# Patient Record
Sex: Male | Born: 1995
Health system: Southern US, Community
[De-identification: ages and names within clinical notes are randomized; demographics above are authoritative.]

## PROBLEM LIST (undated history)

## (undated) DIAGNOSIS — T7840XA Allergy, unspecified, initial encounter: Secondary | ICD-10-CM

## (undated) DIAGNOSIS — Z9109 Other allergy status, other than to drugs and biological substances: Secondary | ICD-10-CM

## (undated) DIAGNOSIS — R41840 Attention and concentration deficit: Secondary | ICD-10-CM

## (undated) DIAGNOSIS — F4010 Social phobia, unspecified: Secondary | ICD-10-CM

## (undated) DIAGNOSIS — J45909 Unspecified asthma, uncomplicated: Secondary | ICD-10-CM

## (undated) DIAGNOSIS — J189 Pneumonia, unspecified organism: Secondary | ICD-10-CM

## (undated) HISTORY — DX: Social phobia, unspecified: F40.10

## (undated) HISTORY — DX: Allergy, unspecified, initial encounter: T78.40XA

## (undated) HISTORY — DX: Unspecified asthma, uncomplicated: J45.909

## (undated) HISTORY — DX: Pneumonia, unspecified organism: J18.9

## (undated) HISTORY — DX: Attention and concentration deficit: R41.840

---
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
## 2016-01-19 DIAGNOSIS — W208XXA Other cause of strike by thrown, projected or falling object, initial encounter: Secondary | ICD-10-CM | POA: Diagnosis not present

## 2016-01-19 DIAGNOSIS — S8992XA Unspecified injury of left lower leg, initial encounter: Secondary | ICD-10-CM | POA: Diagnosis not present

## 2016-01-19 DIAGNOSIS — W19XXXA Unspecified fall, initial encounter: Secondary | ICD-10-CM | POA: Diagnosis not present

## 2016-01-19 DIAGNOSIS — Z23 Encounter for immunization: Secondary | ICD-10-CM | POA: Diagnosis not present

## 2016-01-19 DIAGNOSIS — S6991XA Unspecified injury of right wrist, hand and finger(s), initial encounter: Secondary | ICD-10-CM | POA: Diagnosis not present

## 2016-01-22 DIAGNOSIS — N200 Calculus of kidney: Secondary | ICD-10-CM | POA: Diagnosis not present

## 2016-02-18 DIAGNOSIS — K080 Exfoliation of teeth due to systemic causes: Secondary | ICD-10-CM | POA: Diagnosis not present

## 2016-03-03 DIAGNOSIS — H52203 Unspecified astigmatism, bilateral: Secondary | ICD-10-CM | POA: Diagnosis not present

## 2016-05-18 DIAGNOSIS — B9689 Other specified bacterial agents as the cause of diseases classified elsewhere: Secondary | ICD-10-CM | POA: Diagnosis not present

## 2016-05-18 DIAGNOSIS — R0989 Other specified symptoms and signs involving the circulatory and respiratory systems: Secondary | ICD-10-CM | POA: Diagnosis not present

## 2016-05-18 DIAGNOSIS — J329 Chronic sinusitis, unspecified: Secondary | ICD-10-CM | POA: Diagnosis not present

## 2016-07-28 DIAGNOSIS — J0140 Acute pansinusitis, unspecified: Secondary | ICD-10-CM | POA: Diagnosis not present

## 2016-08-20 ENCOUNTER — Emergency Department (INDEPENDENT_AMBULATORY_CARE_PROVIDER_SITE_OTHER)
Admission: EM | Admit: 2016-08-20 | Discharge: 2016-08-20 | Disposition: A | Discharge status: Home / Self Care | Payer: BLUE CROSS/BLUE SHIELD | Source: Home / Self Care | Attending: Family Medicine | Admitting: Family Medicine

## 2016-08-20 ENCOUNTER — Emergency Department (INDEPENDENT_AMBULATORY_CARE_PROVIDER_SITE_OTHER): Payer: BLUE CROSS/BLUE SHIELD

## 2016-08-20 ENCOUNTER — Encounter: Payer: Self-pay | Admitting: Emergency Medicine

## 2016-08-20 DIAGNOSIS — J189 Pneumonia, unspecified organism: Secondary | ICD-10-CM

## 2016-08-20 DIAGNOSIS — J181 Lobar pneumonia, unspecified organism: Secondary | ICD-10-CM | POA: Diagnosis not present

## 2016-08-20 DIAGNOSIS — J4521 Mild intermittent asthma with (acute) exacerbation: Secondary | ICD-10-CM

## 2016-08-20 DIAGNOSIS — R05 Cough: Secondary | ICD-10-CM | POA: Diagnosis not present

## 2016-08-20 MED ORDER — PREDNISONE 20 MG PO TABS: ORAL_TABLET | ORAL | 0 refills | Status: DC

## 2016-08-20 MED ORDER — DEXAMETHASONE SODIUM PHOSPHATE 10 MG/ML IJ SOLN
10.0000 mg | Freq: Once | INTRAMUSCULAR | Status: AC
  Administered 2016-08-20: 10 mg via INTRAMUSCULAR

## 2016-08-20 MED ORDER — IPRATROPIUM-ALBUTEROL 0.5-2.5 (3) MG/3ML IN SOLN
3.0000 mL | Freq: Once | RESPIRATORY_TRACT | Status: AC
  Administered 2016-08-20: 3 mL via RESPIRATORY_TRACT

## 2016-08-20 MED ORDER — ALBUTEROL SULFATE HFA 108 (90 BASE) MCG/ACT IN AERS: 1.0000 | INHALATION_SPRAY | Freq: Four times a day (QID) | RESPIRATORY_TRACT | 0 refills | Status: DC | PRN

## 2016-08-20 MED ORDER — AZITHROMYCIN 250 MG PO TABS: 250.0000 mg | ORAL_TABLET | Freq: Every day | ORAL | 0 refills | Status: DC

## 2016-08-20 NOTE — ED Provider Notes (Signed)
CSN: [id number]     Arrival date & time 08/20/16  1606 History   First MD Initiated Contact with Patient 08/20/16 1629     Chief Complaint  Patient presents with  . Cough   (Consider location/radiation/quality/duration/timing/severity/associated sxs/prior Treatment) HPI  Tanner Vincent is a 21 y.o. male presenting to UC with c/o 2 days of productive cough with brown and green mucous. Cough was so bad yesterday he almost vomited.  Hx of asthma and environmental allergies but he does not currently have an inhaler. Denies fever, chills, nausea, vomiting or diarrhea. Pt notes his girlfriend has been sick for longer but pt notes she is not as sick as him.     Past Medical History:  Diagnosis Date  . Environmental allergies    History reviewed. No pertinent surgical history. No family history on file. Social History  Substance Use Topics  . Smoking status: Light Tobacco Smoker  . Smokeless tobacco: Never Used  . Alcohol use Yes    Review of Systems  Constitutional: Negative for chills and fever.  HENT: Positive for congestion, postnasal drip and rhinorrhea. Negative for ear pain, sore throat, trouble swallowing and voice change.   Respiratory: Positive for cough and chest tightness. Negative for shortness of breath.   Cardiovascular: Negative for chest pain and palpitations.  Gastrointestinal: Negative for abdominal pain, diarrhea, nausea and vomiting.  Musculoskeletal: Negative for arthralgias, back pain and myalgias.  Skin: Negative for rash.    Allergies  Other  Home Medications   Prior to Admission medications   Medication Sig Start Date End Date Taking? Authorizing Provider  albuterol (PROVENTIL HFA;VENTOLIN HFA) 108 (90 Base) MCG/ACT inhaler Inhale 1-2 puffs into the lungs every 6 (six) hours as needed for wheezing or shortness of breath. 08/20/16   Junius Finner, PA-C  azithromycin (ZITHROMAX) 250 MG tablet Take 1 tablet (250 mg total) by mouth daily. Take first 2 tablets  together, then 1 every day until finished. 08/20/16   Junius Finner, PA-C  predniSONE (DELTASONE) 20 MG tablet 3 tabs po day one, then 2 po daily x 4 days 08/20/16   Junius Finner, PA-C   Meds Ordered and Administered this Visit   Medications  dexamethasone (DECADRON) injection 10 mg (10 mg Intramuscular Given 08/20/16 1639)  ipratropium-albuterol (DUONEB) 0.5-2.5 (3) MG/3ML nebulizer solution 3 mL (3 mLs Nebulization Given 08/20/16 1640)    BP 100/65 (BP Location: Left Arm)   Pulse 69   Temp 98.5 F (36.9 C) (Oral)   Ht 6\' 3"  (1.905 m)   Wt 202 lb (91.6 kg)   SpO2 96%   BMI 25.25 kg/m  No data found.   Physical Exam  Constitutional: He is oriented to person, place, and time. He appears well-developed and well-nourished. No distress.  HENT:  Head: Normocephalic and atraumatic.  Right Ear: Tympanic membrane normal.  Left Ear: Tympanic membrane normal.  Nose: Nose normal.  Mouth/Throat: Uvula is midline, oropharynx is clear and moist and mucous membranes are normal.  Eyes: EOM are normal.  Neck: Normal range of motion. Neck supple.  Cardiovascular: Normal rate and regular rhythm.   Pulmonary/Chest: Effort normal. No stridor. No respiratory distress. He has wheezes. He has rales.  Diffuse wheeze and rales w/o respiratory distress. No accessory muscle use.   Musculoskeletal: Normal range of motion.  Lymphadenopathy:    He has no cervical adenopathy.  Neurological: He is alert and oriented to person, place, and time.  Skin: Skin is warm and dry. He is not diaphoretic.  Psychiatric: He has a normal mood and affect. His behavior is normal.  Nursing note and vitals reviewed.   Urgent Care Course     Procedures (including critical care time)  Labs Review Labs Reviewed - No data to display  Imaging Review Dg Chest 2 View  Result Date: 08/20/2016 CLINICAL DATA:  Productive cough with green sputum for 2 days, brown mucus, wheezing EXAM: CHEST  2 VIEW COMPARISON:  None FINDINGS:  Normal heart size, mediastinal contours, and pulmonary vascularity. Minimal central peribronchial thickening. Atelectasis versus infiltrate in retrocardiac LEFT lower lobe. Lungs otherwise clear. No pleural effusion or pneumothorax. Bones unremarkable. IMPRESSION: Atelectasis versus infiltrate in retrocardiac LEFT lower lobe. Electronically Signed   By: Ulyses SouthwardMark  Boles M.D.   On: 08/20/2016 17:00     MDM   1. Pneumonia of left lower lobe due to infectious organism (HCC)   2. Mild intermittent asthma with exacerbation    Hx and exam concerning for Left lower lobe pneumonia.  Tx in UC: Decadron 10mg  IM and duoneb  Lungs: still diffuse wheeze but pt states he does feel somewhat improved.  Rx: Prednisone, azithromycin, albuterol F/u with PCP in 1 week if not improving, sooner if worsening. Resource guide provided.     Junius Finnerrin O'Malley, PA-C 08/20/16 1717

## 2016-08-20 NOTE — ED Triage Notes (Signed)
Productive cough with green, brown mucus, congestion x 2 days

## 2016-08-20 NOTE — Discharge Instructions (Signed)
°  You may take 400-600mg  Ibuprofen (Motrin) every 6-8 hours for fever and pain  Alternate with Tylenol  You may take 500mg  Tylenol every 4-6 hours as needed for fever and pain  Follow-up with your primary care provider next week for recheck of symptoms if not improving.  Be sure to drink plenty of fluids and rest, at least 8hrs of sleep a night, preferably more while you are sick. Return urgent care or go to closest ER if you cannot keep down fluids/signs of dehydration, fever not reducing with Tylenol, difficulty breathing/wheezing, stiff neck, worsening condition, or other concerns (see below)  Please take antibiotics as prescribed and be sure to complete entire course even if you start to feel better to ensure infection does not come back.  You have been prescribed 5 days of prednisone, an oral steroid to help with inflammation in your chest and coughing.  You may start this medication tomorrow with breakfast as you were given the first dose today in urgent care.

## 2016-08-26 ENCOUNTER — Other Ambulatory Visit: Payer: Self-pay

## 2016-08-26 ENCOUNTER — Encounter: Payer: Self-pay | Admitting: Physician Assistant

## 2016-08-26 ENCOUNTER — Ambulatory Visit (INDEPENDENT_AMBULATORY_CARE_PROVIDER_SITE_OTHER): Payer: BLUE CROSS/BLUE SHIELD | Admitting: Physician Assistant

## 2016-08-26 VITALS — BP 118/79 | HR 82 | Ht 74.0 in | Wt 201.0 lb

## 2016-08-26 DIAGNOSIS — J189 Pneumonia, unspecified organism: Secondary | ICD-10-CM | POA: Insufficient documentation

## 2016-08-26 DIAGNOSIS — R41840 Attention and concentration deficit: Secondary | ICD-10-CM | POA: Diagnosis not present

## 2016-08-26 DIAGNOSIS — J181 Lobar pneumonia, unspecified organism: Secondary | ICD-10-CM | POA: Diagnosis not present

## 2016-08-26 DIAGNOSIS — F4010 Social phobia, unspecified: Secondary | ICD-10-CM | POA: Diagnosis not present

## 2016-08-26 DIAGNOSIS — R062 Wheezing: Secondary | ICD-10-CM | POA: Diagnosis not present

## 2016-08-26 DIAGNOSIS — J3089 Other allergic rhinitis: Secondary | ICD-10-CM | POA: Diagnosis not present

## 2016-08-26 HISTORY — DX: Social phobia, unspecified: F40.10

## 2016-08-26 HISTORY — DX: Attention and concentration deficit: R41.840

## 2016-08-26 MED ORDER — BUDESONIDE-FORMOTEROL FUMARATE 80-4.5 MCG/ACT IN AERO: 2.0000 | INHALATION_SPRAY | Freq: Two times a day (BID) | RESPIRATORY_TRACT | 3 refills | Status: DC

## 2016-08-26 MED ORDER — IPRATROPIUM-ALBUTEROL 0.5-2.5 (3) MG/3ML IN SOLN: 3.0000 mL | Freq: Once | RESPIRATORY_TRACT | Status: DC

## 2016-08-26 MED ORDER — MONTELUKAST SODIUM 10 MG PO TABS: 10.0000 mg | ORAL_TABLET | Freq: Every day | ORAL | 3 refills | Status: DC

## 2016-08-26 MED ORDER — ATOMOXETINE HCL 10 MG PO CAPS: ORAL_CAPSULE | ORAL | 0 refills | Status: DC

## 2016-08-26 MED ORDER — NONFORMULARY OR COMPOUNDED ITEM: 0 refills | Status: DC

## 2016-08-26 NOTE — Progress Notes (Signed)
HPI:                                                                Tanner Vincent is a 21 y.o. male who [facility] today for pneumonia follow-up  Patient with history of allergic rhinitis and allergies was diagnosed with CAP of LLL in urgent care on 08/20/16. Patient reports he feels 80% improved. Still has daily wheezing and occasional cough productive of green sputum. Endorses dyspnea with daily activities. He has used Albuterol inhaler 5 times over the past week. Denies hemoptysis. ]Denies fever, chills, myalgias, malaise. He does smoke marijuana 4-5 days per week.  Patient also reports anxiety and social phobia. Patient states symptoms have been present for years. He states that he took a friends Vyvanse and this helped. He endorses irritability, difficulty relaxing, and difficulty concentrating most days. He denies depressed mood or suicidal thinking. He has never taken medication for anxiety or depression.   Past Medical History:  Diagnosis Date  . Allergy   . Asthma   . Environmental allergies    No past surgical history on file. Social History  Substance Use Topics  . Smoking status: Never Smoker  . Smokeless tobacco: Never Used  . Alcohol use Yes   family history includes COPD in his paternal aunt and paternal uncle.  ROS: negative except as noted in the HPI  Medications: Current Outpatient Prescriptions  Medication Sig Dispense Refill  . atomoxetine (STRATTERA) 10 MG capsule Take 1 capsule by mouth twice a day for 1 week, then increase to two capsules twice a day 90 capsule 0  . budesonide-formoterol (SYMBICORT) 80-4.5 MCG/ACT inhaler Inhale 2 puffs into the lungs 2 (two) times daily. 1 Inhaler 3  . montelukast (SINGULAIR) 10 MG tablet Take 1 tablet (10 mg total) by mouth daily. 30 tablet 3  . NONFORMULARY OR COMPOUNDED ITEM Dispense 1 spacer for MDI inhaler 1 each 0   Current Facility-Administered Medications   Medication Dose Route Frequency Provider Last Rate Last Dose  . ipratropium-albuterol (DUONEB) 0.5-2.5 (3) MG/3ML nebulizer solution 3 mL  3 mL Nebulization Once Carlis Stableharley Elizabeth Cummings, PA-C       No Known Allergies     Objective:  BP 118/79   Pulse 82   Ht 6\' 2"  (1.88 m)   Wt 201 lb (91.2 kg)   BMI 25.81 kg/m  Gen: well-groomed, cooperative, not ill-appearing, no distress HEENT: normal conjunctiva, TM's clear, oropharynx clear, moist mucus membranes, no frontal or maxillary sinus tenderness Pulm: Normal work of breathing, normal phonation, coarse breath sounds with expiratory wheezes bilaterally CV: Normal rate, regular rhythm, s1 and s2 distinct, no murmurs, clicks or rubs  Neuro: alert and oriented x 3, EOM's intact Lymph: no cervical or tonsillar adenopathy Skin: warm and dry, no rashes or lesions on exposed skin, no cyanosis Psych: good eye contact, slightly anxious affect, euthymic mood, normal speech and thought content  CXR 08/20/16 EXAM: CHEST  2 VIEW  COMPARISON:  None  FINDINGS: Normal heart size, mediastinal contours, and pulmonary vascularity.  Minimal central peribronchial thickening.  Atelectasis versus infiltrate in retrocardiac LEFT lower lobe.  Lungs otherwise clear.  No pleural effusion or pneumothorax.  Bones unremarkable.  IMPRESSION: Atelectasis versus infiltrate in retrocardiac  LEFT lower lobe.   Electronically Signed   By: Ulyses Southward M.D.   On: 08/20/2016 17:00  Depression screen PHQ 2/9 08/26/2016  Decreased Interest 3  Down, Depressed, Hopeless 0  PHQ - 2 Score 3  Altered sleeping 2  Tired, decreased energy 2  Change in appetite 1  Feeling bad or failure about yourself  2  Trouble concentrating 2  Moving slowly or fidgety/restless 1  Suicidal thoughts 0  PHQ-9 Score 13   GAD 7 : Generalized Anxiety Score 08/26/2016  Nervous, Anxious, on Edge 2  Control/stop worrying 1  Worry too much - different things 1  Trouble  relaxing 2  Restless 1  Easily annoyed or irritable 3  Afraid - awful might happen 0  Total GAD 7 Score 10     Assessment and Plan: 21 y.o. male with   Wheezing, CAP of LLL - pneumonia is improved. Suspect there is underlying reactive airway disease. Duoneb given in clinic today and patient felt improvement of symptoms - starting symbicort 2 puffs bid. Educated to use spacer and rinse mouth thoroughly to prevent thrush - instructed to avoid smoking marijuana. Encouraged to take deep breaths regularly to prevent atelectasis  - follow-up spirometry in 6 weeks - repeat CXR in 6 weeks since patient does smoke and continues to have wheezing and dyspnea. Guidelines recommend follow-up CXR for resolution of pneumonia in 7-12 weeks since radiographic findings lag behind clinical resolution - budesonide-formoterol (SYMBICORT) 80-4.5 MCG/ACT inhaler; Inhale 2 puffs into the lungs 2 (two) times daily.  Dispense: 1 Inhaler; Refill: 3 - DG Chest 2 View; Future  Chronic non-seasonal allergic rhinitis, unspecified trigger - montelukast (SINGULAIR) 10 MG tablet; Take 1 tablet (10 mg total) by mouth daily.  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 SNRI should benefit both anxiety/depressive symptoms and any ADHD that may be underlying - recommended 4 week follow-up, but patient prefers to follow-up in 6 weeks - will refer for formal ADHD testing if patient does not respond to Strattera - atomoxetine (STRATTERA) 10 MG capsule; Take 1 capsule by mouth twice a day for 1 week, then increase to two capsules twice a day  Dispense: 90 capsule; Refill: 0   Patient education and anticipatory guidance given Patient agrees with treatment plan Follow-up in 6 weeks or sooner as needed   Levonne Hubert PA-C

## 2016-08-26 NOTE — Patient Instructions (Addendum)
Start Symbicort inhaler - 2 puffs twice a day Use spacer  Rinse mouth well after each use to prevent oral thrush  Start Singulair - 1 tab daily for allergies  Avoiding smoking  Make sure you are taking deep breathes regularly to prevent collapse of your small airways  Return if you develop fever or worsening shortness of breath  Repeat Chest Xray in 6 weeks Spirometry (pulmonary function testing) in 6 weeks

## 2016-08-31 ENCOUNTER — Telehealth: Payer: Self-pay | Admitting: *Deleted

## 2016-08-31 NOTE — Telephone Encounter (Signed)
Pre Authorization sent to cover my meds.[id number]

## 2016-09-03 NOTE — Telephone Encounter (Signed)
This medication has already been approved. (see scanned letter)

## 2016-09-07 ENCOUNTER — Encounter: Payer: Self-pay | Admitting: Emergency Medicine

## 2016-09-07 ENCOUNTER — Emergency Department
Admission: EM | Admit: 2016-09-07 | Discharge: 2016-09-07 | Disposition: A | Discharge status: Home / Self Care | Payer: BLUE CROSS/BLUE SHIELD | Source: Home / Self Care | Attending: Family Medicine | Admitting: Family Medicine

## 2016-09-07 ENCOUNTER — Emergency Department (INDEPENDENT_AMBULATORY_CARE_PROVIDER_SITE_OTHER): Payer: BLUE CROSS/BLUE SHIELD

## 2016-09-07 DIAGNOSIS — R05 Cough: Secondary | ICD-10-CM | POA: Diagnosis not present

## 2016-09-07 DIAGNOSIS — R062 Wheezing: Secondary | ICD-10-CM

## 2016-09-07 DIAGNOSIS — R918 Other nonspecific abnormal finding of lung field: Secondary | ICD-10-CM

## 2016-09-07 DIAGNOSIS — R058 Other specified cough: Secondary | ICD-10-CM

## 2016-09-07 MED ORDER — PREDNISONE 20 MG PO TABS: ORAL_TABLET | ORAL | 0 refills | Status: DC

## 2016-09-07 MED ORDER — DOXYCYCLINE HYCLATE 100 MG PO CAPS: 100.0000 mg | ORAL_CAPSULE | Freq: Two times a day (BID) | ORAL | 0 refills | Status: DC

## 2016-09-07 NOTE — ED Provider Notes (Signed)
CSN: [id number]     Arrival date & time 09/07/16  1613 History   First MD Initiated Contact with Patient 09/07/16 1720     Chief Complaint  Patient presents with  . Pneumonia   (Consider location/radiation/quality/duration/timing/severity/associated sxs/prior Treatment) HPI  Hashir Deleeuw is a 21 y.o. male presenting to UC with c/o continued productive cough, chest tightness and wheeze since being diagnosed with pneumonia on 08/20/16.  He has completed a course of azithromycin and prednisone with moderate relief.  He was prescribed an albuterol inhaler but has not been using as he has only been using his Symbicort twice daily.  Denies fever, chills, n/v/d.    Past Medical History:  Diagnosis Date  . Allergy   . Asthma   . Environmental allergies    History reviewed. No pertinent surgical history. Family History  Problem Relation Age of Onset  . COPD Paternal Aunt   . COPD Paternal Uncle    Social History  Substance Use Topics  . Smoking status: Never Smoker  . Smokeless tobacco: Never Used  . Alcohol use Yes    Review of Systems  Constitutional: Negative for chills and fever.  HENT: Positive for congestion. Negative for ear pain, sore throat, trouble swallowing and voice change.   Respiratory: Positive for cough, chest tightness, shortness of breath and wheezing.   Cardiovascular: Negative for chest pain and palpitations.  Gastrointestinal: Negative for abdominal pain, diarrhea, nausea and vomiting.  Musculoskeletal: Negative for arthralgias, back pain and myalgias.  Skin: Negative for rash.    Allergies  Patient has no known allergies.  Home Medications   Prior to Admission medications   Medication Sig Start Date End Date Taking? Authorizing Provider  atomoxetine (STRATTERA) 10 MG capsule Take 1 capsule by mouth twice a day for 1 week, then increase to two capsules twice a day 08/26/16   Carlis Stable, PA-C  budesonide-formoterol [facility]) 80-4.5 MCG/ACT  inhaler Inhale 2 puffs into the lungs 2 (two) times daily. 08/26/16   Carlis Stable, PA-C  doxycycline (VIBRAMYCIN) 100 MG capsule Take 1 capsule (100 mg total) by mouth 2 (two) times daily. One po bid x 7 days 09/07/16   Junius Finner, PA-C  montelukast (SINGULAIR) 10 MG tablet Take 1 tablet (10 mg total) by mouth daily. 08/26/16   Carlis Stable, PA-C  NONFORMULARY OR COMPOUNDED ITEM Dispense 1 spacer for MDI inhaler 08/26/16   Carlis Stable, PA-C  predniSONE (DELTASONE) 20 MG tablet 3 tabs po day one, then 2 po daily x 4 days 09/07/16   Junius Finner, PA-C   Meds Ordered and Administered this Visit  Medications - No data to display  BP 108/70 (BP Location: Left Arm)   Pulse 70   Temp 97.9 F (36.6 C) (Oral)   Ht 6\' 3"  (1.905 m)   Wt 206 lb (93.4 kg)   SpO2 99%   BMI 25.75 kg/m  No data found.   Physical Exam  Constitutional: He appears well-developed and well-nourished. No distress.  HENT:  Head: Normocephalic and atraumatic.  Right Ear: Tympanic membrane normal.  Left Ear: Tympanic membrane normal.  Nose: Nose normal.  Mouth/Throat: Uvula is midline, oropharynx is clear and moist and mucous membranes are normal.  Eyes: Conjunctivae are normal. No scleral icterus.  Neck: Normal range of motion.  Cardiovascular: Normal rate, regular rhythm and normal heart sounds.   Pulmonary/Chest: Effort normal. No respiratory distress. He has wheezes. He has rhonchi ( faint, diffuse). He has no rales. He exhibits  no tenderness.  Abdominal: Soft. He exhibits no distension. There is no tenderness. There is no guarding.  Musculoskeletal: Normal range of motion.  Neurological: He is alert.  Skin: Skin is warm and dry. He is not diaphoretic.  Nursing note and vitals reviewed.   Urgent Care Course     Procedures (including critical care time)  Labs Review Labs Reviewed - No data to display  Imaging Review Dg Chest 2 View  Result Date: 09/07/2016 CLINICAL  DATA:  Productive cough and wheezing. Left lower lobe pneumonia. EXAM: CHEST  2 VIEW COMPARISON:  08/20/2016 FINDINGS: There has been almost complete clearing of the streaky infiltrate at the left lung base posterior medially. The lungs are otherwise clear. Heart size and pulmonary vascularity are normal. No effusions. No bone abnormality. IMPRESSION: Almost complete clearing of the streaky infiltrate at the left lung base. Electronically Signed   By: Francene BoyersJames  Maxwell M.D.   On: 09/07/2016 17:28    MDM   1. Productive cough   2. Wheeze    Pt c/o continued cough despite recent treatment of pneumonia. O2 Sat 99% on RA  Wheeze and rhonchi noted on exam CXR: almost complete clearing of the streaky infiltrate at the Left lung base.  Rx: Prednisone Encouraged to use his rescue albuterol inhaler every 4-6 hours as needed for cough. Prescription to hold with expiration date for doxycycline. Pt to fill if persistent fever develops or not improving despite prednisone and albuterol    Junius Finnerrin O'Malley, PA-C 09/07/16 1811

## 2016-09-07 NOTE — ED Triage Notes (Signed)
Diagnosed with pneumonia on 08/20/16, still has productive cough and wheezing

## 2016-09-07 NOTE — Discharge Instructions (Signed)
°  The pneumonia is nearly gone based on today's chest x-ray.  It is recommended you try another round of prednisone and be sure to use your albuterol inhaler every 4-6 hours as needed for cough and chest tightness.  If your cough continues for 3-4 days or you develop a fever, be sure to start taking the second antibiotic, doxycycline.

## 2016-09-15 ENCOUNTER — Telehealth: Payer: Self-pay | Admitting: *Deleted

## 2016-09-15 ENCOUNTER — Telehealth: Payer: Self-pay | Admitting: Physician Assistant

## 2016-09-15 MED ORDER — DOXYCYCLINE HYCLATE 100 MG PO CAPS: 100.0000 mg | ORAL_CAPSULE | Freq: Two times a day (BID) | ORAL | 0 refills | Status: DC

## 2016-09-15 NOTE — Telephone Encounter (Signed)
Patient called requesting an antibiotic. He said the prednisone he got at his last appointment did not work. Please advise. Thanks!

## 2016-09-15 NOTE — Telephone Encounter (Signed)
Patient reports he did not fill the antibiotic but is in need of it now and has lost the paper copy. Please escribe.

## 2016-09-15 NOTE — Telephone Encounter (Signed)
Called return to pt. Pt advised that he would have to make a f/u appointment for further evaluation. -EH/RMA

## 2016-09-23 DIAGNOSIS — R41840 Attention and concentration deficit: Secondary | ICD-10-CM | POA: Diagnosis not present

## 2016-09-23 DIAGNOSIS — J189 Pneumonia, unspecified organism: Secondary | ICD-10-CM | POA: Diagnosis not present

## 2016-09-23 DIAGNOSIS — R062 Wheezing: Secondary | ICD-10-CM | POA: Diagnosis not present

## 2016-09-23 DIAGNOSIS — R0602 Shortness of breath: Secondary | ICD-10-CM | POA: Diagnosis not present

## 2016-09-27 DIAGNOSIS — J181 Lobar pneumonia, unspecified organism: Secondary | ICD-10-CM | POA: Diagnosis not present

## 2016-09-27 DIAGNOSIS — R0602 Shortness of breath: Secondary | ICD-10-CM | POA: Diagnosis not present

## 2016-09-27 DIAGNOSIS — J209 Acute bronchitis, unspecified: Secondary | ICD-10-CM | POA: Diagnosis not present

## 2016-10-07 ENCOUNTER — Ambulatory Visit (INDEPENDENT_AMBULATORY_CARE_PROVIDER_SITE_OTHER): Payer: BLUE CROSS/BLUE SHIELD | Admitting: Physician Assistant

## 2016-10-07 VITALS — BP 126/75 | HR 73 | Ht 75.0 in | Wt 209.0 lb

## 2016-10-07 DIAGNOSIS — F4010 Social phobia, unspecified: Secondary | ICD-10-CM | POA: Diagnosis not present

## 2016-10-07 DIAGNOSIS — J4540 Moderate persistent asthma, uncomplicated: Secondary | ICD-10-CM

## 2016-10-07 LAB — PULMONARY FUNCTION TEST

## 2016-10-07 MED ORDER — ALBUTEROL SULFATE (2.5 MG/3ML) 0.083% IN NEBU
2.5000 mg | INHALATION_SOLUTION | Freq: Once | RESPIRATORY_TRACT | Status: AC
  Administered 2016-10-07: 2.5 mg via RESPIRATORY_TRACT

## 2016-10-07 MED ORDER — HYDROXYZINE HCL 50 MG PO TABS: 50.0000 mg | ORAL_TABLET | Freq: Four times a day (QID) | ORAL | 3 refills | Status: DC | PRN

## 2016-10-07 MED ORDER — FLUOXETINE HCL 20 MG PO TABS: 20.0000 mg | ORAL_TABLET | Freq: Every day | ORAL | 5 refills | Status: DC

## 2016-10-07 NOTE — Patient Instructions (Addendum)
- Fluoxetine 1/2 tab daily for 1 week, then increase to full tab daily - Hydroxyzine 1 tab as needed for panic attacks (may cause drowsiness) - Make counseling appt  Call [facility] at [phone] and ask to speak with our Intake Specialist.   Social Anxiety Disorder, Adult Social anxiety disorder, previously called social phobia, is a mental disorder. People with social anxiety disorder often feel nervous, afraid, or embarrassed when they are around other people in social situations. They worry that other people are judging or criticizing them for how they look, what they say, or how they act. Social anxiety disorder is more than just occasional shyness or self-consciousness. It can cause severe emotional distress. It can interfere with daily life activities. Social anxiety disorder also may lead to alcohol or drug use and even suicide. Social anxiety disorder is a common mental disorder. It can develop at any time, but it usually starts in the teenage years. What are the causes? The cause of this condition is not known. It may involve genes that are passed through families. Stressful events may trigger anxiety. What increases the risk? This condition is more likely to develop in:  People who have a family history of anxiety disorders.  Women.  People who have a condition that makes them feel self-conscious or nervous, such as a stutter or a chronic disease. What are the signs or symptoms? The main symptom of this condition is fear of being criticized or judged in social situations. You may be afraid to:  Speak in public.  Go shopping.  Use a public bathroom.  Eat at a restaurant.  Go to work.  Interact with unfamiliar people. Extreme fear and anxiety may cause physical symptoms, including:  Blushing.  Racing heart.  Sweating.  Shaky hands or voice.  Confusion.  Light-headedness.  Upset stomach, diarrhea, or vomiting.  Shortness of breath. How is this  diagnosed? Your health care provider can diagnose this condition based on your history, symptoms, and behavior in social situations. Your health care provider may ask you about your use of alcohol or drugs, including prescription medicines. Your health care provider may refer you to a mental health specialist for further evaluation or treatment. How is this treated? Treatment for this condition may include:  Cognitive behavioral therapy. This type of talk therapy helps you learn to replace negative thoughts and behaviors with positive ones. This may include learning better coping skills and ways to control anxiety.  Exposure therapy. You will be exposed to social situations that cause fear. The treatment starts with situations that you can manage. Over time, you will learn to manage harder situations.  Antidepressant medicines. These medicines may be used for a short time along with other therapies.  Beta blockers. These medicines may help to control anxiety.  Biofeedback. This process trains you to manage your body's response (physiological response) through breathing techniques and relaxation methods. You will work with a therapist while machines are used to monitor your physical symptoms.  Relaxation and coping techniques. These include deep breathing, self-talk, meditation, visual imagery, and yoga. Relaxation techniques help to keep you calm in social situations. These treatments are often used in combination. Follow these instructions at home:  Take over-the-counter and prescription medicines only as told by your health care provider.  Practice relaxation and coping strategies as taught by your health care provider.  Return to social activities as suggested by your health care provider.  Keep all follow-up visits as told by your health care provider.  This is important. Contact a health care provider if:  Your symptoms do not improve.  Your symptoms get worse.  You have signs of  depression, such as:  A persistently sad, cranky, or irritable mood.  Loss of enjoyment in activities that used to bring you joy.  Change in weight or eating.  Changes in sleeping habits.  Avoiding friends or family members.  Loss of energy for normal tasks.  Feelings of guilt or worthlessness.  You become very isolated.  You find it very hard to speak or interact with others.  You are using drugs.  You are drinking more alcohol than normal. Get help right away if:  You self-harm.  You have suicidal thoughts. If you ever feel like you may hurt yourself or others, or have thoughts about taking your own life, get help right away. You can go to your nearest emergency department or call:  Your local emergency services (911 in the [place]  A suicide crisis helpline, such as the National Suicide Prevention Lifeline at 531-662-7222. This is open 24 hours a day. Summary  Social anxiety disorder may cause you to feel nervous, afraid, or embarrassed when you are around other people in social situations.  Social anxiety disorder is a common mental disorder. It can develop at any time, but it usually starts in the teenage years.  Treatment includes talk therapy, exposure therapy, medicines, biofeedback, relaxation techniques, or a combination of two or more treatments. This information is not intended to replace advice given to you by your health care provider. Make sure you discuss any questions you have with your health care provider. Document Released: 05/07/2005 Document Revised: 05/01/2016 Document Reviewed: 05/01/2016 Elsevier Interactive Patient Education  2017 [employer].

## 2016-10-07 NOTE — Progress Notes (Signed)
HPI:                                                                Tanner Vincent is a 21 y.o. male who presents to [facility] today for spirometry  Patient with PMH of PNA, asthma, and allergies presents today for spirometry. Patient was diagnosed with CAP of LLL on 09/07/16. He saw me in follow-up on 08/26/16 and had some wheezing and was started on Symbicort. He returned to urgent care for similar symptoms on 09/07/16 and was treated with Prednisone and Doxycyline. He then went to [place] urgent care on 09/27/16 and was diagnosed with acute bronchitis with a normal CXR and treated with Medrol and Cefdinir. He continues to endorse a slight wheeze and occasional nonproductive cough. Denies SOB, DOE. He is not taking Singulair or Symbicort and thinks he may have thrown it out.   In reviewing his history, Tanner Vincent was evaluated by [facility] in 2015 for similar symptoms. At that time, it was unclear whether he had asthma or protracted bacterial bronchitis due to normal spirometry and persistent symptoms that responded to Albuterol. Tanner Vincent has been on Singulair, Advair, and Symbicort in the past. His IgE on 10/02/2013 was normal. Pulmonology felt strongly that Tanner Vincent did have asthma and wanted to trial him on [place] and Zyflo. If no improvement, they recommended bronchoscopy or CT. It appears he was followed by Dr. Lacretia Nicks with [facility], but I am unable to see these records.   Additionally, patient reports that Strattera has not helped his symptoms. He reports a long-standing history of low self-esteem and social anxiety. He states only since graduating high school has it become difficult to manage his anxiety and he has found himself avoiding social situations. He was accepted at [employer], but he deferred. He would like to get his anxiety controlled so that he can go to college next semester.   Past Medical History:  Diagnosis Date  . Allergy    . Asthma   . Environmental allergies    No past surgical history on file. Social History  Substance Use Topics  . Smoking status: Never Smoker  . Smokeless tobacco: Never Used  . Alcohol use Yes   family history includes COPD in his paternal aunt and paternal uncle.  ROS: negative except as noted in the HPI  Medications: Current Outpatient Prescriptions  Medication Sig Dispense Refill  . atomoxetine (STRATTERA) 10 MG capsule Take 1 capsule by mouth twice a day for 1 week, then increase to two capsules twice a day 90 capsule 0  . montelukast (SINGULAIR) 10 MG tablet Take 1 tablet (10 mg total) by mouth daily. 30 tablet 3  . NONFORMULARY OR COMPOUNDED ITEM Dispense 1 spacer for MDI inhaler (Patient not taking: Reported on 10/07/2016) 1 each 0   No current facility-administered medications for this visit.    No Known Allergies     Objective:  BP 126/75   Pulse 73   Ht  (1.905 m)   Wt 209 lb (94.8 kg)   SpO2 100%   BMI 26.12 kg/m  Gen: well-groomed, cooperative, not ill-appearing, no distress HEENT: normal conjunctiva, TM's clear, oropharynx clear, moist mucus membranes, no frontal or maxillary sinus tenderness Pulm: Normal  work of breathing, normal phonation, clear to auscultation bilaterally, no wheezes, rales or rhonchi CV: Normal rate, regular rhythm, s1 and s2 distinct, no murmurs, clicks or rubs  Neuro: alert and oriented x 3 Lymph: no cervical or tonsillar adenopathy Skin: warm and dry, no rashes or lesions on exposed skin, no cyanosis   XR Chest Pa And Lateral (09/27/2016 2:02 PM) XR Chest Pa And Lateral (09/27/2016 2:02 PM)  Specimen Performing Laboratory   [id number]    XR Chest Pa And Lateral (09/27/2016 2:02 PM)  Impressions  IMPRESSION:  No plain radiographic evidence of acute cardiopulmonary disease.    XR Chest Pa And Lateral (09/27/2016 2:02 PM)  Narrative  INDICATION: Shortness of breath  COMPARISON: None  TECHNIQUE: PA and lateral views  of the chest    FINDINGS:  Support apparatus: None  Heart: Normal cardiac size.  Mediastinum: No gross adenopathy.Normal contours.  Lungs and pleura: No consolidation, mass, or pulmonary edema.No pneumothorax or pleural effusion.  Upper abdomen: Unremarkable.  Bones: No acute fracture or aggressive osseous lesion.  Additional findings: None      XR Chest Pa And Lateral (09/27/2016 2:02 PM)  Procedure Note  Acute Interface, Incoming Rad Results - 09/27/2016 2:18 PM EDT  INDICATION: Shortness of breath COMPARISON: None TECHNIQUE: PA and lateral views of the chest  FINDINGS: Support apparatus: None Heart: Normal cardiac size. Mediastinum: No gross adenopathy. Normal contours. Lungs and pleura: No consolidation, mass, or pulmonary edema. No pneumothorax or pleural effusion. Upper abdomen: Unremarkable. Bones: No acute fracture or aggressive osseous lesion. Additional findings: None   IMPRESSION: No plain radiographic evidence of acute cardiopulmonary disease.     Depression screen PHQ 2/9 08/26/2016  Decreased Interest 3  Down, Depressed, Hopeless 0  PHQ - 2 Score 3  Altered sleeping 2  Tired, decreased energy 2  Change in appetite 1  Feeling bad or failure about yourself  2  Trouble concentrating 2  Moving slowly or fidgety/restless 1  Suicidal thoughts 0  PHQ-9 Score 13   GAD 7 : Generalized Anxiety Score 08/26/2016  Nervous, Anxious, on Edge 2  Control/stop worrying 1  Worry too much - different things 1  Trouble relaxing 2  Restless 1  Easily annoyed or irritable 3  Afraid - awful might happen 0  Total GAD 7 Score 10   Spirometry 10/07/2016 Pre-bronchodilator FVC(L) 6.19 FEV1(L) 5.21, 100% predicted FEV1/FVC(%) 84.1  FVC(L) 5.52 FEV1(L) 4.78 FEV1/FVC(%) 86.6  Assessment and Plan: 21 y.o. male with   1. Moderate persistent reactive airway disease without complication - Spirometry: Pre & Post Eval normal - pneumonia has resolved  clinically and on CXR - patient has chronic symptoms and has been diagnosed with asthma by pulmonology in the past despite normal spirometry - cont Singulair nightly  - cont Symbicort bid. If symptoms persist, will switch to [facility] - cont Albuterol prn  2. Social anxiety disorder - Fluoxetine 1/2 tab daily for 1 week, then increase to full tab daily - Hydroxyzine 1 tab as needed for panic attacks (may cause drowsiness) - FLUoxetine (PROZAC) 20 MG tablet; Take 1 tablet (20 mg total) by mouth daily.  Dispense: 30 tablet; Refill: 5 - hydrOXYzine (ATARAX/VISTARIL) 50 MG tablet; Take 1 tablet (50 mg total) by mouth every 6 (six) hours as needed for anxiety.  Dispense: 30 tablet; Refill: 3 - encouraged counseling appointment to learn coping skills for social anxiety - recommended 4 week follow-up since starting new medication. Patient requests 8 week follow-up  Patient education and  anticipatory guidance given Patient agrees with treatment plan Follow-up in 8 weeks or sooner as needed  Levonne Hubert PA-C

## 2016-12-02 ENCOUNTER — Ambulatory Visit: Payer: BLUE CROSS/BLUE SHIELD | Admitting: Physician Assistant

## 2016-12-02 DIAGNOSIS — Z0189 Encounter for other specified special examinations: Secondary | ICD-10-CM

## 2017-01-11 ENCOUNTER — Emergency Department
Admission: EM | Admit: 2017-01-11 | Discharge: 2017-01-11 | Disposition: A | Discharge status: Home / Self Care | Payer: BLUE CROSS/BLUE SHIELD | Source: Home / Self Care | Attending: Family Medicine | Admitting: Family Medicine

## 2017-01-11 ENCOUNTER — Encounter: Payer: Self-pay | Admitting: Emergency Medicine

## 2017-01-11 DIAGNOSIS — J069 Acute upper respiratory infection, unspecified: Secondary | ICD-10-CM | POA: Diagnosis not present

## 2017-01-11 DIAGNOSIS — Z8701 Personal history of pneumonia (recurrent): Secondary | ICD-10-CM

## 2017-01-11 MED ORDER — DOXYCYCLINE HYCLATE 100 MG PO CAPS: 100.0000 mg | ORAL_CAPSULE | Freq: Two times a day (BID) | ORAL | 0 refills | Status: DC

## 2017-01-11 MED ORDER — PREDNISONE 20 MG PO TABS: ORAL_TABLET | ORAL | 0 refills | Status: DC

## 2017-01-11 MED ORDER — FLUTICASONE PROPIONATE 50 MCG/ACT NA SUSP: 2.0000 | Freq: Every day | NASAL | 2 refills | Status: DC

## 2017-01-11 MED ORDER — BENZONATATE 100 MG PO CAPS: 100.0000 mg | ORAL_CAPSULE | Freq: Three times a day (TID) | ORAL | 0 refills | Status: DC

## 2017-01-11 NOTE — Discharge Instructions (Signed)
°  Your symptoms are likely due to a virus such as the common cold, however, if you developing worsening chest congestion with shortness of breath, persistent fever (>100.4*F) for 3 days, or symptoms not improving in 4-5 days, you may fill the antibiotic (doxycycline.  If you do fill the antibiotic,  please take antibiotics as prescribed and be sure to complete entire course even if you start to feel better to ensure infection does not come back. ° ° ° ° °

## 2017-01-11 NOTE — ED Triage Notes (Signed)
Pt c/o nasal congestion, cough with mucous and post nasal drip that started x2 days ago. Denies meds for this.

## 2017-01-11 NOTE — ED Provider Notes (Signed)
CSN: [id number]     Arrival date & time 01/11/17  1507 History   First MD Initiated Contact with Patient 01/11/17 1537     Chief Complaint  Patient presents with  . Nasal Congestion   (Consider location/radiation/quality/duration/timing/severity/associated sxs/prior Treatment) HPI  Undray Allman is a 21 y.o. male presenting to UC with c/o nasal congestion, mildly productive cough with brown sputum, and post-nasal drip that started 2 days ago.  He has not taken any medications this morning for current symptoms but he has used OTC chloraseptic spray for mild sore throat.  Hx of asthma. He recently moved and cannot find his inhaler. Hx of pneumonia in March of this year.  Pt states symptoms feel similar but not as bad. Denies fever, chills, n/v/d. No known sick contacts.    Past Medical History:  Diagnosis Date  . Allergy   . Asthma   . Environmental allergies    History reviewed. No pertinent surgical history. Family History  Problem Relation Age of Onset  . COPD Paternal Aunt   . COPD Paternal Uncle    Social History  Substance Use Topics  . Smoking status: Never Smoker  . Smokeless tobacco: Never Used  . Alcohol use Yes    Review of Systems  Constitutional: Negative for chills and fever.  HENT: Positive for congestion, postnasal drip, sinus pressure and sore throat. Negative for ear pain, trouble swallowing and voice change.   Respiratory: Positive for cough. Negative for shortness of breath.   Cardiovascular: Negative for chest pain and palpitations.  Gastrointestinal: Negative for abdominal pain, diarrhea, nausea and vomiting.  Musculoskeletal: Negative for arthralgias, back pain and myalgias.  Skin: Negative for rash.    Allergies  Patient has no known allergies.  Home Medications   Prior to Admission medications   Medication Sig Start Date End Date Taking? Authorizing Provider  benzonatate (TESSALON) 100 MG capsule Take 1-2 capsules (100-200 mg total) by mouth  every 8 (eight) hours. 01/11/17   Lurene Shadow, PA-C  budesonide-formoterol (SYMBICORT) 80-4.5 MCG/ACT inhaler Inhale into the lungs. 08/26/16   [provider]  doxycycline (VIBRAMYCIN) 100 MG capsule Take 1 capsule (100 mg total) by mouth 2 (two) times daily. One po bid x 7 days 01/11/17   Lurene Shadow, PA-C  FLUoxetine (PROZAC) 20 MG tablet Take 1 tablet (20 mg total) by mouth daily. 10/07/16   Carlis Stable, PA-C  fluticasone [facility]) 50 MCG/ACT nasal spray Place 2 sprays into both nostrils daily. 01/11/17   Lurene Shadow, PA-C  hydrOXYzine (ATARAX/VISTARIL) 50 MG tablet Take 1 tablet (50 mg total) by mouth every 6 (six) hours as needed for anxiety. 10/07/16   Carlis Stable, PA-C  montelukast (SINGULAIR) 10 MG tablet Take 1 tablet (10 mg total) by mouth daily. 08/26/16   Carlis Stable, PA-C  predniSONE (DELTASONE) 20 MG tablet 3 tabs po day one, then 2 po daily x 4 days 01/11/17   Lurene Shadow, PA-C  VENTOLIN HFA 108 (90 Base) MCG/ACT inhaler Take 1-2 puffs by mouth every 4 (four) hours. 08/20/16   [provider]   Meds Ordered and Administered this Visit  Medications - No data to display  BP 97/65 (BP Location: Right Arm)   Pulse 62   Temp 98.3 F (36.8 C) (Oral)   Wt 198 lb (89.8 kg)   SpO2 99%   BMI 24.75 kg/m  No data found.   Physical Exam  Constitutional: He is oriented to person, place, and  time. He appears well-developed and well-nourished. He appears distressed.  HENT:  Head: Normocephalic and atraumatic.  Right Ear: Tympanic membrane normal.  Left Ear: Tympanic membrane normal.  Nose: Mucosal edema present. Right sinus exhibits no maxillary sinus tenderness and no frontal sinus tenderness. Left sinus exhibits no maxillary sinus tenderness and no frontal sinus tenderness.  Mouth/Throat: Uvula is midline, oropharynx is clear and moist and mucous membranes are normal.  Eyes: EOM are normal.  Neck: Normal range of  motion. Neck supple.  Cardiovascular: Normal rate and regular rhythm.   Pulmonary/Chest: Effort normal. He has no decreased breath sounds. He has no wheezes. He has rhonchi ( cleared with cough) in the left upper field.  Musculoskeletal: Normal range of motion.  Neurological: He is alert and oriented to person, place, and time.  Skin: Skin is warm and dry. He is not diaphoretic.  Psychiatric: He has a normal mood and affect. His behavior is normal.  Nursing note and vitals reviewed.   Urgent Care Course     Procedures (including critical care time)  Labs Review Labs Reviewed - No data to display  Imaging Review No results found.   MDM   1. Upper respiratory tract infection, unspecified type   2. History of pneumonia    Due to short duration of symptoms and pt afebrile, encouraged symptomatic treatment at this time  Rx: Tessalon, albuterol inhaler, and prednisone. May take OTC mucinex, acetaminophen and ibuprofen Fluids and rest  Due to hx of pneumonia, prescription to hold with expiration date for doxycyline. Pt to fill if persistent fever develops or not improving in 1 week.      Lurene Shadowhelps, Kyi Romanello O, [place] JerseyPA-C 01/11/17 1622

## 2017-01-25 DIAGNOSIS — R05 Cough: Secondary | ICD-10-CM | POA: Diagnosis not present

## 2017-01-25 DIAGNOSIS — J209 Acute bronchitis, unspecified: Secondary | ICD-10-CM | POA: Diagnosis not present

## 2017-03-15 DIAGNOSIS — H527 Unspecified disorder of refraction: Secondary | ICD-10-CM | POA: Diagnosis not present

## 2017-04-13 ENCOUNTER — Encounter: Payer: Self-pay | Admitting: Physician Assistant

## 2017-04-13 ENCOUNTER — Ambulatory Visit (INDEPENDENT_AMBULATORY_CARE_PROVIDER_SITE_OTHER): Payer: BLUE CROSS/BLUE SHIELD | Admitting: Physician Assistant

## 2017-04-13 ENCOUNTER — Ambulatory Visit (INDEPENDENT_AMBULATORY_CARE_PROVIDER_SITE_OTHER): Payer: BLUE CROSS/BLUE SHIELD

## 2017-04-13 VITALS — BP 106/67 | HR 75 | Temp 98.0°F | Wt 205.0 lb

## 2017-04-13 DIAGNOSIS — J4531 Mild persistent asthma with (acute) exacerbation: Secondary | ICD-10-CM | POA: Diagnosis not present

## 2017-04-13 DIAGNOSIS — R062 Wheezing: Secondary | ICD-10-CM | POA: Diagnosis not present

## 2017-04-13 DIAGNOSIS — R05 Cough: Secondary | ICD-10-CM

## 2017-04-13 MED ORDER — PREDNISONE 50 MG PO TABS: ORAL_TABLET | ORAL | 0 refills | Status: DC

## 2017-04-13 MED ORDER — AZITHROMYCIN 250 MG PO TABS: ORAL_TABLET | ORAL | 0 refills | Status: DC

## 2017-04-13 MED ORDER — VENTOLIN HFA 108 (90 BASE) MCG/ACT IN AERS: 1.0000 | INHALATION_SPRAY | RESPIRATORY_TRACT | 0 refills | Status: DC | PRN

## 2017-04-13 NOTE — Patient Instructions (Signed)
Peak Flow Meter  A peak flow meter is a device that helps you determine how well your asthma is being controlled. The device measures the flow of air out of your lungs. This is a simple but important tool in daily asthma management. Peak flow meters are available over the counter.  The readings from the meter will help you and your health care provider:  · Determine the severity of your asthma.  · Evaluate the effectiveness of your current treatment.  · Determine when to add or stop certain medicines.  · Recognize an asthma attack before signs or symptoms appear.  · Decide when to seek emergency care.    What are the risks?  · Dizziness. Breathing too quickly into the peak flow meter may cause dizziness. This could cause you to pass out. Take your time so you do not get dizzy or light-headed.  · False readings. Not cleaning your meter may cause false results. You may not know that your asthma is getting better or getting worse, making you to start or stop treatment improperly.  How to find your personal best  Your "personal best" is the highest peak flow rate you can reach when you feel good and have no asthma symptoms. This can be used as your standard for comparing your peak flow meter readings. Because everyone's asthma is different, your personal best will be unique to you.  Your health care provider will help you to figure out your personal best. Typically, you will take readings once or twice a day for 2 weeks when you are not having symptoms. The highest reading during the trial period is your personal best. Because your lung function can change over time, your personal best should be measured each year.  How to use your peak flow meter  1. Move the upper marker to the number that is your personal best.  2. Move the lower marker to the bottom of the numbered scale.  3. Connect the mouthpiece to the peak flow meter.  4. Stand up.  5. Take a deep breath. Make sure you fill your lungs completely.  6. Place your  lips tightly around the mouthpiece. Blow as hard and as fast as you can with a single breath (forced exhalation), as if you are blowing out candles. If your lips are not placed tightly around the mouthpiece of the peak flow meter, you will get incorrect low readings.  7. At the end of your forced exhale, check to see what number the lower marker landed on. This is your peak flow rate.  8. Move the lower marker back to the bottom of the numbered scale.  9.   Always write down the results in your asthma diary. After using your peak flow meter, rest and breathe slowly and easily. Keep a record of your progress. Your health care provider can provide you with a simple table to help with this. For the most accurate readings, it is important to keep your peak flow meter clean. Follow the manufacturer's instructions on how to take care of your peak flow meter.  Your health care provider will give you instructions on when to do regular monitoring. You may also need to check your peak flow when:  · Coughing, wheezing, or other asthma symptoms wake you up at night.  · Your asthma symptoms worsen during the day.  · Your breathing is made worse because of a cold, flu, or other respiratory illness.  · You need to use your quick-relief, "  rescue medicine." It is best to check your peak flow rate before you use rescue medicine, and then 20-30 minutes afterward. This will help determine whether you need to take the rescue medicine again.    How to use your results  Use color-coded zones on the meter to see how your peak flow rate compares to your personal best. If your peak flow readings fall too far below your personal best into the yellow or red zone, you will need to take action to prevent or minimize an asthma attack. The color code for each zone reflects progressively more severe symptoms:  Green Zone = Stable    · Peak flow rate between 80% and 100% of your personal best. Your asthma is under good control when your peak flow  rate is in the green zone.  · When you are in the green zone, you are not likely to be experiencing any asthma symptoms.  · Only take your regular, preventive medicine. Your rescue medicine is not required.  Yellow Zone = Caution    · Peak flow rate between 50% and 80% of your personal best. Your asthma is getting worse and could be improved.  · You may have begun coughing, wheezing, or feeling chest tightness. Sometimes peak flow rates dip down into the yellow zone before asthma symptoms appear.  · Consider increasing or changing your asthma medicine. This may include using your rescue medicine. If you have an asthma action plan, follow each step listed for the yellow zone, including medicine changes.  Red Zone = Danger    · Peak flow rate below 50% of your personal best. This may mean you are having, or are at risk of having, a medical emergency.  · Your coughing, wheezing, or shortness of breath may have become severe. Do not wait to use your rescue medicine. Stop whatever you are doing and use your rescue inhaler, nebulizer, or other medicines to open your airways.  · If you have an asthma action plan, follow each step listed for the red zone, including medicine changes and when to seek emergency medical care.  Contact a health care provider if:  You are in the yellow zone. If you have an asthma action plan, follow all of the steps listed under the yellow zone section of the plan. Let your health care provider know that you are in the yellow zone.  Get help right away if:  You are in the red zone. If you have an asthma action plan, follow all of the steps listed under the red zone section of the plan while you are seeking immediate medical care.  Summary  · A peak flow meter is a device that helps you determine how well your asthma is being controlled. The device measures the flow of air out of your lungs.  · Readings from the meter will help you determine the severity of your asthma, whether your treatment is  working, and when to start or stop treatment.  · Measure your personal best every year during periods of no symptoms. The meter will compare this reading to your regular readings to determine your condition at a given time.  · Work with your health care provider to understand what each zone (green, yellow, red) means and what actions to take in each zone.  This information is not intended to replace advice given to you by your health care provider. Make sure you discuss any questions you have with your health care provider.  Document Released:   04/05/2007 Document Revised: 06/04/2016 Document Reviewed: 06/04/2016  Elsevier Interactive Patient Education © 2017 [employer]

## 2017-04-13 NOTE — Progress Notes (Signed)
HPI:                                                                Tanner Vincent is a 21 y.o. male who presents to [facility]: Primary Care Sports Medicine today for cough  Cough  This is a recurrent problem. Episode onset: x 1 month. The problem has been unchanged. The problem occurs hourly. The cough is productive of purulent sputum. Associated symptoms include a fever (tactile), nasal congestion and wheezing. Pertinent negatives include no chills. The symptoms are aggravated by exercise. Treatments tried: +Dayquil. His past medical history is significant for asthma and pneumonia.    Past Medical History:  Diagnosis Date  . Allergy   . Asthma   . CAP (community acquired pneumonia)   . Environmental allergies    No past surgical history on file. Social History  Substance Use Topics  . Smoking status: Never Smoker  . Smokeless tobacco: Never Used  . Alcohol use Yes   family history includes COPD in his paternal aunt and paternal uncle.  ROS: negative except as noted in the HPI  Medications: Current Outpatient Prescriptions  Medication Sig Dispense Refill  . azithromycin (ZITHROMAX Z-PAK) 250 MG tablet Take 2 tablets (500 mg) on  Day 1,  followed by 1 tablet (250 mg) once daily on Days 2 through 5. 6 tablet 0  . predniSONE (DELTASONE) 50 MG tablet One tab PO daily for 5 days. 5 tablet 0  . VENTOLIN HFA 108 (90 Base) MCG/ACT inhaler Inhale 1-2 puffs into the lungs every 4 (four) hours as needed for wheezing or shortness of breath. 2 Inhaler 0   No current facility-administered medications for this visit.    No Known Allergies     Objective:  BP 106/67   Pulse 75   Temp 98 F (36.7 C) (Oral)   Wt 205 lb (93 kg)   SpO2 98%   BMI 25.62 kg/m  Gen:  alert, not ill-appearing, no distress, appropriate for age HEENT: head normocephalic without obvious abnormality, conjunctiva and cornea clear, trachea midline Pulm: Normal work of breathing, normal  phonation, clear to auscultation bilaterally, no wheezes, rales or rhonchi CV: Normal rate, regular rhythm, s1 and s2 distinct, no murmurs, clicks or rubs  Neuro: alert and oriented x 3, no tremor MSK: extremities atraumatic, normal gait and station Skin: intact, no rashes on exposed skin, no jaundice, no cyanosis Psych: well-groomed, cooperative, good eye contact, euthymic mood, affect mood-congruent, speech is articulate, and thought processes clear and goal-directed   No results found for this or any previous visit (from the past 72 hour(s)). Dg Chest 2 View  Result Date: 04/13/2017 CLINICAL DATA:  Cough, wheezing. EXAM: CHEST  2 VIEW COMPARISON:  Radiographs of September 07, 2016. FINDINGS: The heart size and mediastinal contours are within normal limits. Both lungs are clear. No pneumothorax or pleural effusion is noted. The visualized skeletal structures are unremarkable. IMPRESSION: No active cardiopulmonary disease. Electronically Signed   By: Lupita Raider, M.D.   On: 04/13/2017 16:13      Assessment and Plan: 21 y.o. male with   1. Mild persistent asthma with acute exacerbation - Pretest best 457, Peakflow reduced today at 375. SpO2 98% on RA at rest, no respiratory distress.  Chest x-ray today given hx of asthma and pneumonia and symptoms present for 1 month. Steroid burst and empiric antibiotics - patient given peak flow meter and instructed on dosing Ventolin inhaler based on peak flow - VENTOLIN HFA 108 (90 Base) MCG/ACT inhaler; Inhale 1-2 puffs into the lungs every 4 (four) hours as needed for wheezing or shortness of breath.  Dispense: 2 Inhaler; Refill: 0 - DG Chest 2 View; Future - azithromycin (ZITHROMAX Z-PAK) 250 MG tablet; Take 2 tablets (500 mg) on  Day 1,  followed by 1 tablet (250 mg) once daily on Days 2 through 5.  Dispense: 6 tablet; Refill: 0 - predniSONE (DELTASONE) 50 MG tablet; One tab PO daily for 5 days.  Dispense: 5 tablet; Refill: 0  Patient education and  anticipatory guidance given Patient agrees with treatment plan Follow-up in 2 weeks for asthma or sooner as needed if symptoms worsen or fail to improve   Levonne Hubertharley E. Krisha Beegle PA-C

## 2017-04-14 NOTE — Progress Notes (Signed)
Normal chest x-ray Plan does not change

## 2017-04-15 ENCOUNTER — Encounter: Payer: Self-pay | Admitting: Physician Assistant

## 2017-04-15 DIAGNOSIS — J4540 Moderate persistent asthma, uncomplicated: Secondary | ICD-10-CM | POA: Insufficient documentation

## 2017-04-27 ENCOUNTER — Ambulatory Visit: Payer: BLUE CROSS/BLUE SHIELD | Admitting: Physician Assistant

## 2017-04-27 DIAGNOSIS — Z0189 Encounter for other specified special examinations: Secondary | ICD-10-CM

## 2017-05-21 ENCOUNTER — Ambulatory Visit: Payer: BLUE CROSS/BLUE SHIELD | Admitting: Sports Medicine

## 2017-05-21 ENCOUNTER — Encounter: Payer: Self-pay | Admitting: Sports Medicine

## 2017-05-21 ENCOUNTER — Ambulatory Visit (INDEPENDENT_AMBULATORY_CARE_PROVIDER_SITE_OTHER): Payer: BLUE CROSS/BLUE SHIELD

## 2017-05-21 DIAGNOSIS — R062 Wheezing: Secondary | ICD-10-CM | POA: Diagnosis not present

## 2017-05-21 DIAGNOSIS — R0602 Shortness of breath: Secondary | ICD-10-CM | POA: Diagnosis not present

## 2017-05-21 DIAGNOSIS — J4541 Moderate persistent asthma with (acute) exacerbation: Secondary | ICD-10-CM

## 2017-05-21 MED ORDER — FLUTICASONE FUROATE-VILANTEROL 100-25 MCG/INH IN AEPB: 1.0000 | INHALATION_SPRAY | Freq: Every day | RESPIRATORY_TRACT | 11 refills | Status: DC

## 2017-05-21 MED ORDER — PREDNISONE 10 MG (48) PO TBPK: ORAL_TABLET | Freq: Every day | ORAL | 0 refills | Status: DC

## 2017-05-21 MED ORDER — AZITHROMYCIN 250 MG PO TABS: ORAL_TABLET | ORAL | 0 refills | Status: DC

## 2017-05-21 NOTE — Assessment & Plan Note (Signed)
Symptoms nearly every day, coughing, this has occurred even if he is not an exacerbation, he is in mild exacerbation today. Prednisone taper, azithromycin, chest x-ray. We do need a controller agent, adding Breo. He can use his albuterol for rescue and 15 minutes before going for runs. Sometimes during the spring we do have to add a Singulair as well. He will follow-up with me in about a month to see how things are going and keep an asthma diary.

## 2017-05-21 NOTE — Progress Notes (Signed)
  Subjective:    CC: Coughing  HPI: This is a pleasant 21 year old male with a history of asthma, for the past week he has had an increasing sore throat, cough, productive of sputum, wheezing, worse at night.  He only uses albuterol.  Past medical history:  Negative.  See flowsheet/record as well for more information.  Surgical history: Negative.  See flowsheet/record as well for more information.  Family history: Negative.  See flowsheet/record as well for more information.  Social history: Negative.  See flowsheet/record as well for more information.  Allergies, and medications have been entered into the medical record, reviewed, and no changes needed.   Review of Systems: No fevers, chills, night sweats, weight loss, chest pain, or shortness of breath.   Objective:    General: Well Developed, well nourished, and in no acute distress.  Neuro: Alert and oriented x3, extra-ocular muscles intact, sensation grossly intact.  HEENT: Normocephalic, atraumatic, pupils equal round reactive to light, neck supple, no masses, no lymphadenopathy, thyroid nonpalpable.  Oropharynx, nasopharynx, ear canals are unremarkable Skin: Warm and dry, no rashes. Cardiac: Regular rate and rhythm, no murmurs rubs or gallops, no lower extremity edema.  Respiratory: Coarse sounds with expiratory wheezes and prolonged expiratory phase throughout not using accessory muscles, speaking in full sentences.  Impression and Recommendations:    Asthma, moderate persistent Symptoms nearly every day, coughing, this has occurred even if he is not an exacerbation, he is in mild exacerbation today. Prednisone taper, azithromycin, chest x-ray. We do need a controller agent, adding Breo. He can use his albuterol for rescue and 15 minutes before going for runs. Sometimes during the spring we do have to add a Singulair as well. He will follow-up with me in about a month to see how things are going and keep an asthma diary.  I  spent 25 minutes with this patient, greater than 50% was face-to-face time counseling regarding the above diagnoses ___________________________________________ Ihor Austinhomas J. Benjamin Stainhekkekandam, M.D., ABFM., CAQSM. Primary Care and Sports Medicine [facility]  Adjunct Instructor of Family Medicine  [employer]

## 2017-07-02 ENCOUNTER — Ambulatory Visit: Payer: BLUE CROSS/BLUE SHIELD | Admitting: Sports Medicine

## 2017-07-02 DIAGNOSIS — Z0189 Encounter for other specified special examinations: Secondary | ICD-10-CM

## 2018-04-18 IMAGING — DX DG CHEST 2V
2 series · 2 of 2 positions shown · non-contrast
Comparison: None

CLINICAL DATA: Productive cough with green sputum for 2 days, brown
mucus, wheezing

EXAM:
CHEST  2 VIEW

[chest pa]
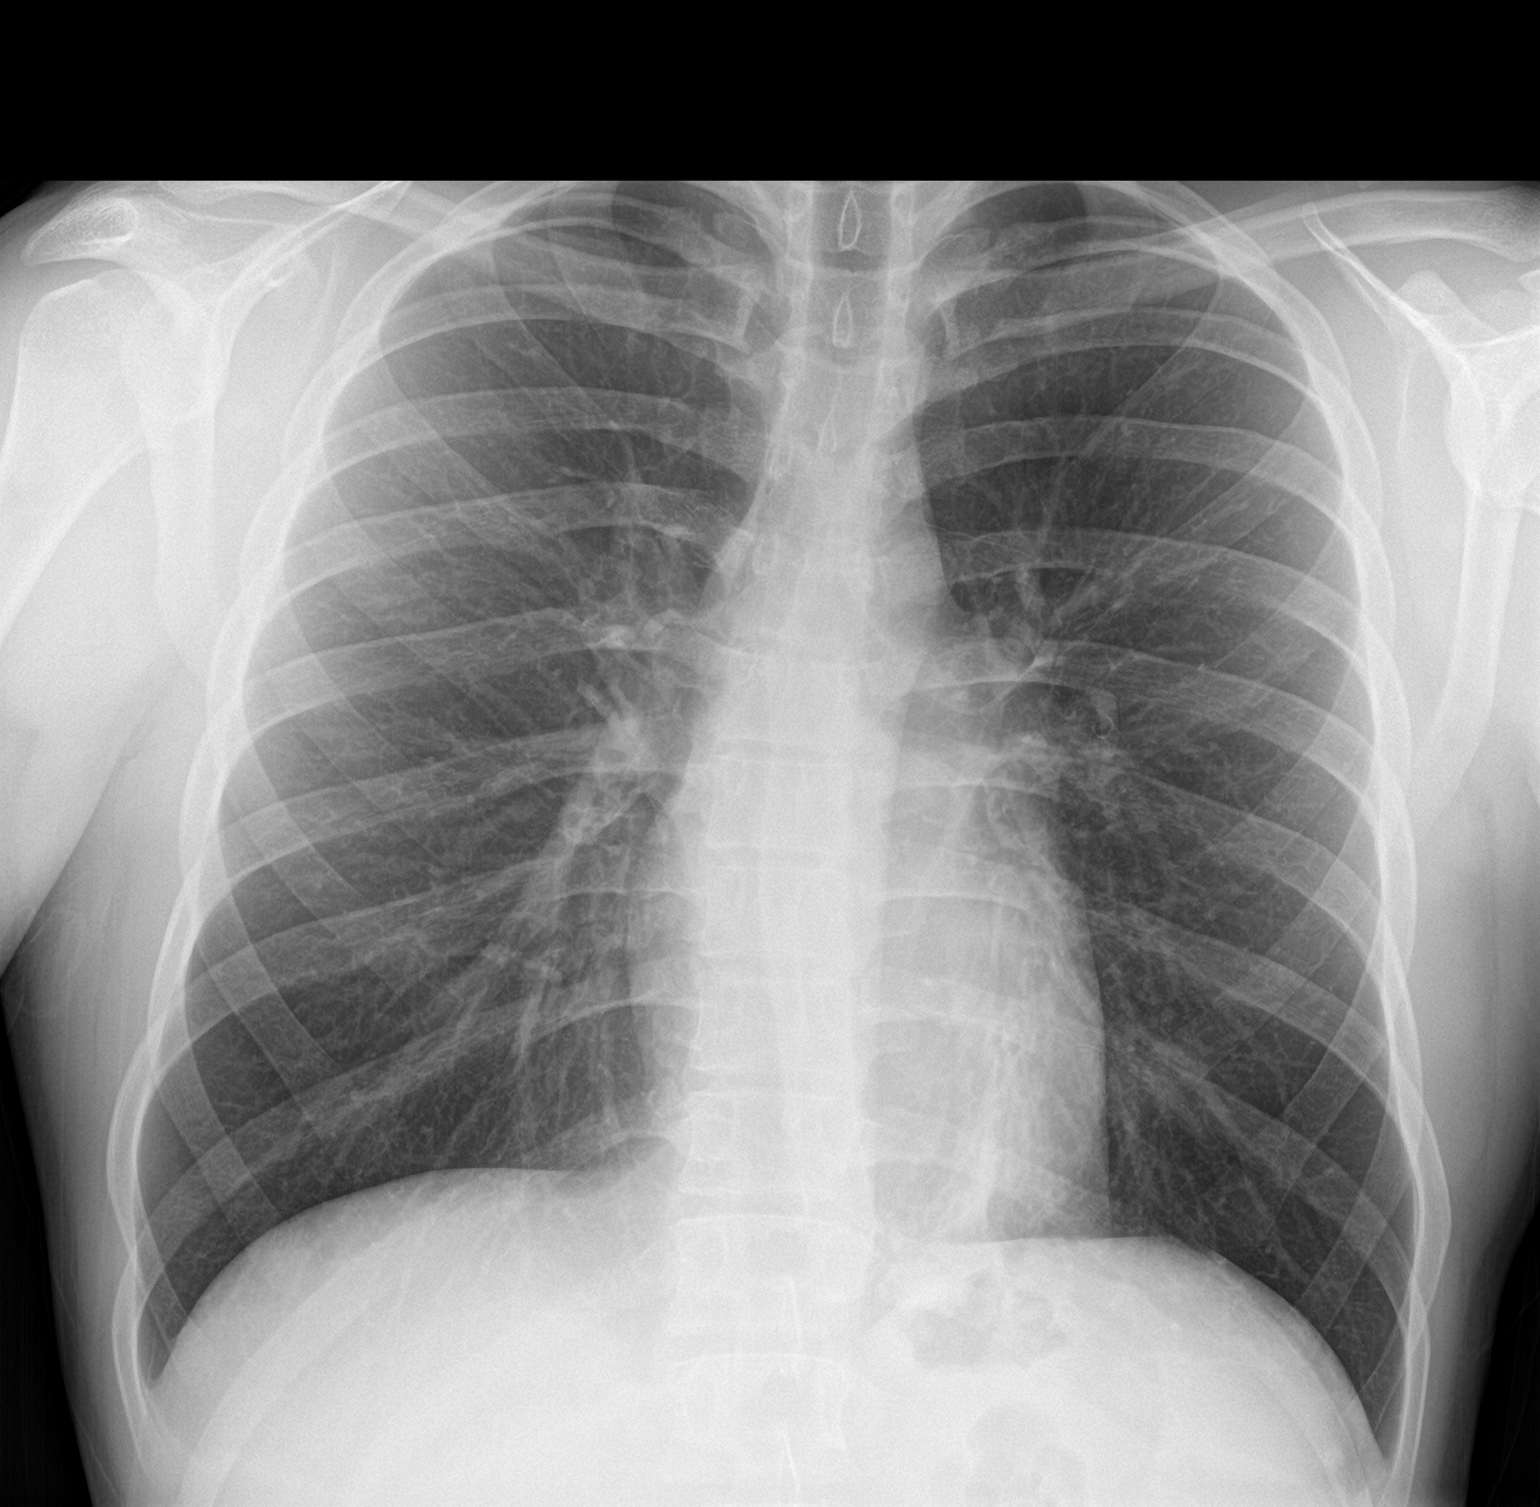

[chest lat]
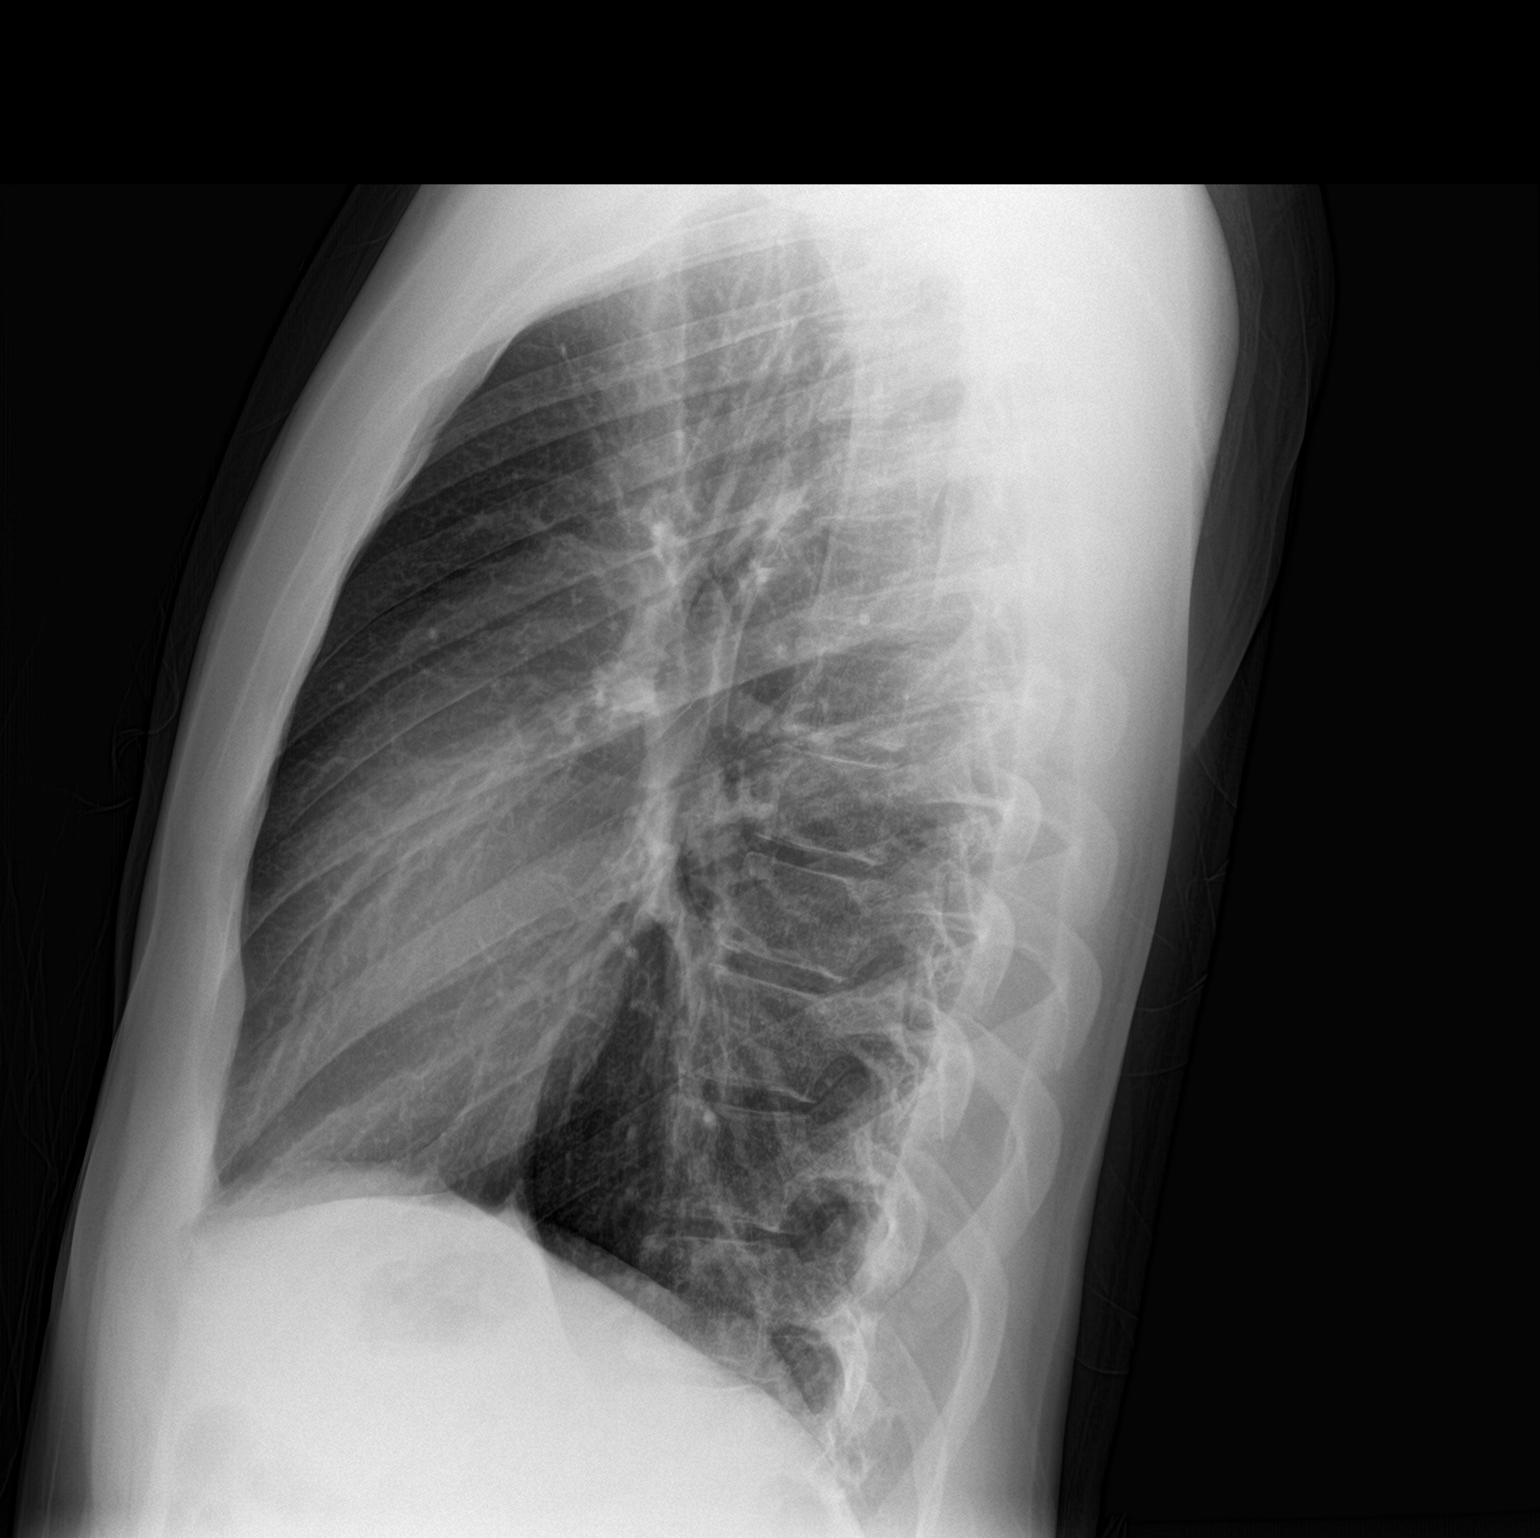

[2 of 2 positions shown; findings below may reference images not displayed]

FINDINGS: Normal heart size, mediastinal contours, and pulmonary vascularity.

Minimal central peribronchial thickening.

Atelectasis versus infiltrate in retrocardiac LEFT lower lobe.

Lungs otherwise clear.

No pleural effusion or pneumothorax.

Bones unremarkable.
IMPRESSION: Atelectasis versus infiltrate in retrocardiac LEFT lower lobe.

## 2018-05-05 ENCOUNTER — Other Ambulatory Visit: Payer: Self-pay | Admitting: Physician Assistant

## 2018-05-05 DIAGNOSIS — R062 Wheezing: Secondary | ICD-10-CM

## 2018-05-31 DIAGNOSIS — H52223 Regular astigmatism, bilateral: Secondary | ICD-10-CM | POA: Diagnosis not present

## 2018-07-09 DIAGNOSIS — H66002 Acute suppurative otitis media without spontaneous rupture of ear drum, left ear: Secondary | ICD-10-CM | POA: Diagnosis not present

## 2018-07-09 DIAGNOSIS — J0140 Acute pansinusitis, unspecified: Secondary | ICD-10-CM | POA: Diagnosis not present

## 2020-01-31 ENCOUNTER — Emergency Department (INDEPENDENT_AMBULATORY_CARE_PROVIDER_SITE_OTHER): Payer: BC Managed Care – PPO

## 2020-01-31 ENCOUNTER — Emergency Department (INDEPENDENT_AMBULATORY_CARE_PROVIDER_SITE_OTHER)
Admission: RE | Admit: 2020-01-31 | Discharge: 2020-01-31 | Disposition: A | Discharge status: Home / Self Care | Payer: BC Managed Care – PPO | Source: Ambulatory Visit | Attending: Family Medicine | Admitting: Family Medicine

## 2020-01-31 ENCOUNTER — Other Ambulatory Visit: Payer: Self-pay

## 2020-01-31 VITALS — BP 116/77 | HR 65 | Temp 98.7°F | Resp 16 | Ht 76.0 in | Wt 199.0 lb

## 2020-01-31 DIAGNOSIS — M7050 Other bursitis of knee, unspecified knee: Secondary | ICD-10-CM

## 2020-01-31 DIAGNOSIS — M25562 Pain in left knee: Secondary | ICD-10-CM | POA: Diagnosis not present

## 2020-01-31 NOTE — ED Triage Notes (Signed)
Pt c/o LT knee pain x 4 days after wrestling with a friend. Denies any specific injury. No OTC meds. Ice applied with no relief.

## 2020-01-31 NOTE — Discharge Instructions (Addendum)
Apply ice pack for 30 minutes 3 to 4 times daily until improved. Wear Ace wrap until swelling and pain decrease.    Begin range of motion and stretching exercises as tolerated.  Take Ibuprofen 200mg , 4 tabs every 8 hours with food.

## 2020-01-31 NOTE — ED Provider Notes (Signed)
[facility]    CSN: [id number] Arrival date & time: 01/31/20  0849      History   Chief Complaint Chief Complaint  Patient presents with  . Knee Pain    appt    HPI Tanner Vincent is a 24 y.o. male.   After wrestling with a friend four days ago, patient has gradually developed pain on the medial aspect of his left knee.  The history is provided by the patient.  Knee Pain Location:  Knee Time since incident:  4 days Injury: yes   Mechanism of injury comment:  Wrestling Knee location:  L knee Pain details:    Quality:  Aching   Radiates to:  Does not radiate   Severity:  Mild   Onset quality:  Gradual   Duration:  4 days   Timing:  Constant   Progression:  Worsening Chronicity:  New Prior injury to area:  No Worsened by:  Flexion and extension Ineffective treatments:  Ice Associated symptoms: no decreased ROM, no fever, no stiffness and no swelling     Past Medical History:  Diagnosis Date  . Allergy   . Asthma   . CAP (community acquired pneumonia)   . Environmental allergies   . Inattention 08/26/2016  . Social anxiety disorder 08/26/2016    Patient Active Problem List   Diagnosis Date Noted  . Asthma, moderate persistent 04/15/2017  . Social anxiety disorder 08/26/2016  . Inattention 08/26/2016  . Chronic non-seasonal allergic rhinitis 04/21/2011    History reviewed. No pertinent surgical history.     Home Medications    Prior to Admission medications   Not on File    Family History Family History  Problem Relation Age of Onset  . COPD Paternal Aunt   . COPD Paternal Uncle     Social History Social History   Tobacco Use  . Smoking status: Current Some Day Smoker    Types: Cigarettes  . Smokeless tobacco: Never Used  Vaping Use  . Vaping Use: Some days  Substance Use Topics  . Alcohol use: Yes  . Drug use: Yes    Frequency: 5.0 times per week    Types: Marijuana     Allergies   Patient has no known  allergies.   Review of Systems Review of Systems  Constitutional: Negative for fever.  Musculoskeletal: Negative for joint swelling and stiffness.  Skin: Negative for color change.  All other systems reviewed and are negative.    Physical Exam Triage Vital Signs ED Triage Vitals  Enc Vitals Group     BP 01/31/20 0906 116/77     Pulse Rate 01/31/20 0906 65     Resp 01/31/20 0906 16     Temp 01/31/20 0906 98.7 F (37.1 C)     Temp Source 01/31/20 0906 Oral     SpO2 01/31/20 0906 99 %     Weight 01/31/20 0901 199 lb (90.3 kg)     Height 01/31/20 0901 6\' 4"  (1.93 m)     Head Circumference --      Peak Flow --      Pain Score 01/31/20 0900 2     Pain Loc --      Pain Edu? --      Excl. in GC? --    No data found.  Updated Vital Signs BP 116/77 (BP Location: Right Arm)   Pulse 65   Temp 98.7 F (37.1 C) (Oral)   Resp 16   Ht 6'  4" (1.93 m)   Wt 90.3 kg   SpO2 99%   BMI 24.22 kg/m   Visual Acuity Right Eye Distance:   Left Eye Distance:   Bilateral Distance:    Right Eye Near:   Left Eye Near:    Bilateral Near:     Physical Exam Vitals and nursing note reviewed.  Constitutional:      General: He is not in acute distress. HENT:     Head: Atraumatic.  Eyes:     Pupils: Pupils are equal, round, and reactive to light.  Cardiovascular:     Rate and Rhythm: Normal rate.  Pulmonary:     Effort: Pulmonary effort is normal.  Musculoskeletal:     Right knee: No swelling, effusion, ecchymosis, bony tenderness or crepitus. Normal range of motion. Tenderness present. No LCL laxity or MCL laxity.     Instability Tests: Anterior drawer test negative. Posterior drawer test negative. Medial McMurray test negative and lateral McMurray test negative.     Left knee: No tenderness.       Legs:     Comments: Left knee has distinct tenderness to palpation over the pes anserine bursa.  Skin:    General: Skin is warm and dry.  Neurological:     Mental Status: He is alert  and oriented to person, place, and time.      UC Treatments / Results  Labs (all labs ordered are listed, but only abnormal results are displayed) Labs Reviewed - No data to display  EKG   Radiology DG Knee Complete 4 Views Left  Result Date: 01/31/2020 CLINICAL DATA:  Medial knee pain EXAM: LEFT KNEE - COMPLETE 4+ VIEW COMPARISON:  None. FINDINGS: No acute fracture or dislocation. Joint spaces and alignment are maintained. No area of erosion or osseous destruction. No unexpected radiopaque foreign body. Soft tissues are unremarkable. IMPRESSION: No acute fracture or dislocation. Electronically Signed   By: Meda Klinefelter MD   On: 01/31/2020 09:37    Procedures Procedures (including critical care time)  Medications Ordered in UC Medications - No data to display  Initial Impression / Assessment and Plan / UC Course  I have reviewed the triage vital signs and the nursing notes.  Pertinent labs & imaging results that were available during my care of the patient were reviewed by me and considered in my medical decision making (see chart for details).      Ace wrap applied. Given sprain treatment instructions with range of motion and stretching exercises. Followup with Dr. Rodney Langton ([facility]) if not improving about two weeks.   Final Clinical Impressions(s) / UC Diagnoses   Final diagnoses:  Pes anserine bursitis     Discharge Instructions     Apply ice pack for 30 minutes 3 to 4 times daily until improved. Wear Ace wrap until swelling and pain decrease.    Begin range of motion and stretching exercises as tolerated.  Take Ibuprofen 200mg , 4 tabs every 8 hours with food.      ED Prescriptions    None        , MD 02/07/20 1722

## 2020-02-12 DIAGNOSIS — R509 Fever, unspecified: Secondary | ICD-10-CM | POA: Diagnosis not present

## 2020-02-12 DIAGNOSIS — J029 Acute pharyngitis, unspecified: Secondary | ICD-10-CM | POA: Diagnosis not present

## 2020-02-12 DIAGNOSIS — R52 Pain, unspecified: Secondary | ICD-10-CM | POA: Diagnosis not present

## 2020-02-12 DIAGNOSIS — U071 COVID-19: Secondary | ICD-10-CM | POA: Diagnosis not present

## 2020-04-12 ENCOUNTER — Other Ambulatory Visit: Payer: Self-pay

## 2020-04-12 ENCOUNTER — Ambulatory Visit (INDEPENDENT_AMBULATORY_CARE_PROVIDER_SITE_OTHER): Payer: BC Managed Care – PPO | Admitting: Family Medicine

## 2020-04-12 ENCOUNTER — Encounter: Payer: Self-pay | Admitting: Family Medicine

## 2020-04-12 VITALS — BP 101/56 | HR 65 | Temp 98.0°F | Ht 76.0 in | Wt 209.0 lb

## 2020-04-12 DIAGNOSIS — Z202 Contact with and (suspected) exposure to infections with a predominantly sexual mode of transmission: Secondary | ICD-10-CM

## 2020-04-12 MED ORDER — AZITHROMYCIN 500 MG PO TABS
1000.0000 mg | ORAL_TABLET | Freq: Once | ORAL | 0 refills | Status: AC
Start: 2020-04-12 — End: 2020-04-12

## 2020-04-12 NOTE — Patient Instructions (Signed)
Chlamydia, Male    Chlamydia is an STD (sexually transmitted disease). It is a bacterial infection that spreads through sexual contact (is contagious). Chlamydia can occur in different areas of the body, including the tube that moves urine from the bladder out of the body (urethra), the throat, or the rectum. This condition is not difficult to treat. However, if left untreated, chlamydia can lead to more serious health problems.  What are the causes?  Chlamydia is caused by the bacteria Chlamydia trachomatis. It is passed from an infected partner during sexual activity. Chlamydia can spread through contact with the genitals, mouth, or rectum.  What are the signs or symptoms?  In some cases, there may not be any symptoms for this condition (asymptomatic), especially early in the infection. If symptoms develop, they may include:  · Burning when urinating.  · Urinating frequently.  · Pain or swelling in the testicles.  · Watery, mucus-like discharge from the penis.  · Redness, soreness, and swelling (inflammation) of the rectum.  · Bleeding or discharge from the rectum.  · Abdominal pain.  · Itching, burning, or redness in the eyes, or discharge from the eyes.  How is this diagnosed?  This condition may be diagnosed based on:  · Urine tests.  · Swab tests. Depending on your symptoms, your health care provider may use a cotton swab to collect discharge from your urethra or rectum to test for the bacteria.  How is this treated?  This condition is treated with antibiotic medicines.  Follow these instructions at home:  Medicines  · Take over-the-counter and prescription medicines only as told by your health care provider.  · Take your antibiotic medicine as told by your health care provider. Do not stop taking the antibiotic even if you start to feel better.  Sexual activity  · Tell sexual partners about your infection. This includes any oral, anal, or vaginal sex partners you have had within 60 days of when your symptoms  started. Sexual partners should also be treated, even if they have no signs of the disease.  · Do not have sex until you and your sexual partners have completed treatment and your health care provider says it is okay. If your health care provider prescribed you a single dose treatment, wait 7 days after taking the treatment before having sex.  General instructions  · It is your responsibility to get your test results. Ask your health care provider, or the department performing the test, when your results will be ready.  · Get plenty of rest.  · Eat a healthy, well-balanced diet.  · Drink enough fluids to keep your urine clear or pale yellow.  · Keep all follow-up visits as told by your health care provider. This is important. You may need to be tested for infection again 3 months after treatment.  How is this prevented?  The only sure way to prevent chlamydia is to avoid sexual intercourse. However, you can lower your risk by:  · Using latex condoms correctly every time you have sexual intercourse.  · Not having multiple sexual partners.  · Asking if your sexual partner has been tested for STIs and had negative results.  Contact a health care provider if:  · You develop new symptoms or your symptoms do not get better after completing treatment.  · You have a fever or chills.  · You have pain during sexual intercourse.  · You develop new joint pain or swelling near your joints.  · You   Chlamydia is an STD (sexually transmitted disease). It is a bacterial infection that spreads (is contagious) through sexual contact.  This condition is not difficult to treat, however, if left untreated, it can lead to more serious health problems.  In some cases, there may not  be any symptoms for this condition (asymptomatic).  This condition is treated with antibiotic medicines.  Using latex condoms correctly every time you have sexual intercourse can help prevent chlamydia. This information is not intended to replace advice given to you by your health care provider. Make sure you discuss any questions you have with your health care provider. Document Revised: 06/23/2017 Document Reviewed: 05/25/2016 Elsevier Patient Education  2020 [employer].

## 2020-04-14 DIAGNOSIS — Z202 Contact with and (suspected) exposure to infections with a predominantly sexual mode of transmission: Secondary | ICD-10-CM | POA: Insufficient documentation

## 2020-04-14 NOTE — Progress Notes (Signed)
Tanner Vincent - 24 y.o. male MRN [id number]  Date of birth: 04/26/1996  Subjective Chief Complaint  Patient presents with  . Exposure to STD    HPI Tanner Vincent is a 24 y.o. male here today for initial visit.  He has been in pretty good health.  Reports that he received a call from his ex-girlfriend and was told that she tested positive for chlamydia.  He denies symptoms including dysuria, hematuria, discharge, lymph node enlargement, fever or testicular pain/swelling.   ROS:  A comprehensive ROS was completed and negative except as noted per HPI  No Known Allergies  Past Medical History:  Diagnosis Date  . Allergy   . Asthma   . CAP (community acquired pneumonia)   . Environmental allergies   . Inattention 08/26/2016  . Social anxiety disorder 08/26/2016    History reviewed. No pertinent surgical history.  Social History   Socioeconomic History  . Marital status: Single    Spouse name: Not on file  . Number of children: Not on file  . Years of education: Not on file  . Highest education level: Not on file  Occupational History  . Not on file  Tobacco Use  . Smoking status: Current Some Day Smoker    Types: Cigarettes  . Smokeless tobacco: Never Used  Vaping Use  . Vaping Use: Some days  Substance and Sexual Activity  . Alcohol use: Yes  . Drug use: Yes    Frequency: 5.0 times per week    Types: Marijuana  . Sexual activity: Yes    Partners: Female  Other Topics Concern  . Not on file  Social History Narrative  . Not on file   Social Determinants of Health   Financial Resource Strain:   . Difficulty of Paying Living Expenses: Not on file  Food Insecurity:   . Worried About Programme researcher, broadcasting/film/video in the Last Year: Not on file  . Ran Out of Food in the Last Year: Not on file  Transportation Needs:   . Lack of Transportation (Medical): Not on file  . Lack of Transportation (Non-Medical): Not on file  Physical Activity:   . Days of Exercise per Week:  Not on file  . Minutes of Exercise per Session: Not on file  Stress:   . Feeling of Stress : Not on file  Social Connections:   . Frequency of Communication with Friends and Family: Not on file  . Frequency of Social Gatherings with Friends and Family: Not on file  . Attends Religious Services: Not on file  . Active Member of Clubs or Organizations: Not on file  . Attends Banker Meetings: Not on file  . Marital Status: Not on file    Family History  Problem Relation Age of Onset  . COPD Paternal Aunt   . COPD Paternal Uncle     Health Maintenance  Topic Date Due  . Hepatitis C Screening  Never done  . COVID-19 Vaccine (1) Never done  . HIV Screening  Never done  . INFLUENZA VACCINE  01/21/2020  . TETANUS/TDAP  01/18/2026     ----------------------------------------------------------------------------------------------------------------------------------------------------------------------------------------------------------------- Physical Exam BP (!) 101/56 (BP Location: Left Arm, Patient Position: Sitting, Cuff Size: Large)   Pulse 65   Temp 98 F (36.7 C)   Ht 6\' 4"  (1.93 m)   Wt 209 lb (94.8 kg)   SpO2 100%   BMI 25.44 kg/m   Physical Exam Constitutional:      Appearance: Normal  appearance.  Eyes:     General: No scleral icterus. Cardiovascular:     Rate and Rhythm: Normal rate and regular rhythm.  Pulmonary:     Effort: Pulmonary effort is normal.     Breath sounds: Normal breath sounds.  Musculoskeletal:     Cervical back: Neck supple.  Neurological:     Mental Status: He is alert.     ------------------------------------------------------------------------------------------------------------------------------------------------------------------------------------------------------------------- Assessment and Plan  Exposure to chlamydia Will go ahead and treat empirically with azithromycin 1g x1.  Testing for GC/Chlamydia ordered.  He  declines HIV and syphilis testing.    Meds ordered this encounter  Medications  . azithromycin (ZITHROMAX) 500 MG tablet    Sig: Take 2 tablets (1,000 mg total) by mouth once for 1 dose.    Dispense:  2 tablet    Refill:  0   Orders Placed This Encounter  Procedures  . Chlamydia/Neisseria Gonorrhoeae RNA,TMA,Urogenital    No follow-ups on file.    This visit occurred during the SARS-CoV-2 public health emergency.  Safety protocols were in place, including screening questions prior to the visit, additional usage of staff PPE, and extensive cleaning of exam room while observing appropriate contact time as indicated for disinfecting solutions.

## 2020-04-14 NOTE — Assessment & Plan Note (Signed)
Will go ahead and treat empirically with azithromycin 1g x1.  Testing for GC/Chlamydia ordered.  He declines HIV and syphilis testing.

## 2020-04-18 LAB — CHLAMYDIA/NEISSERIA GONORRHOEAE RNA,TMA,UROGENTIAL
C. trachomatis RNA, TMA: DETECTED — AB
N. gonorrhoeae RNA, TMA: NOT DETECTED

## 2020-04-18 LAB — CHLAMYDIA TRACHOMATIS,TMA(ALTERNATE TARGET),UROGENTIAL: Chlamydia Trachomatis,TMA(ALT Target),Urogenital: DETECTED — AB

## 2020-07-09 ENCOUNTER — Encounter: Payer: Self-pay | Admitting: Medical-Surgical

## 2020-07-09 ENCOUNTER — Ambulatory Visit: Payer: BC Managed Care – PPO | Admitting: Nurse Practitioner

## 2020-07-09 ENCOUNTER — Other Ambulatory Visit: Payer: Self-pay

## 2020-07-09 ENCOUNTER — Ambulatory Visit (INDEPENDENT_AMBULATORY_CARE_PROVIDER_SITE_OTHER): Payer: BC Managed Care – PPO | Admitting: Medical-Surgical

## 2020-07-09 VITALS — BP 134/74 | HR 71 | Temp 98.2°F | Wt 206.4 lb

## 2020-07-09 DIAGNOSIS — Z202 Contact with and (suspected) exposure to infections with a predominantly sexual mode of transmission: Secondary | ICD-10-CM | POA: Diagnosis not present

## 2020-07-09 NOTE — Progress Notes (Signed)
Subjective:    CC: STI concern  HPI: Pleasant 25 year old male presenting to discuss concern for STI. He was tested in October and found positive for chlamydia. Notes that he was infected by his ex-girlfriend. He was treated with Azithromycin for his infection but is now having right testicle pain with occasional burning with urination. He is not currently sexually active but has been in the last 3 months with 1-2 male partners, no condom use. Denies pain with sex or erection. Does note having white bumps at the base of his penis when he has an erection. These bumps are not painful and do not have fluid in them. The bumps were first noted at the age of 49-15 with first sexual intercourse at 20. Bumps are not visible when not erect. Denies testicular swelling/redness, penile discharge. Does regular self exams in the shower with no lumps/bumps/irregularities noted.   Has recently had rectal itching and blood noted on the tissue after having a BM. Does not receive anal sex and denies constipation. Admits to working out at [employer] with heavy lifting.   I reviewed the past medical history, family history, social history, surgical history, and allergies today and no changes were needed.  Please see the problem list section below in epic for further details.  Past Medical History: Past Medical History:  Diagnosis Date  . Allergy   . Asthma   . CAP (community acquired pneumonia)   . Environmental allergies   . Inattention 08/26/2016  . Social anxiety disorder 08/26/2016   Past Surgical History: History reviewed. No pertinent surgical history. Social History: Social History   Socioeconomic History  . Marital status: Single    Spouse name: Not on file  . Number of children: Not on file  . Years of education: Not on file  . Highest education level: Not on file  Occupational History  . Not on file  Tobacco Use  . Smoking status: Current Some Day Smoker    Types: Cigarettes  . Smokeless  tobacco: Never Used  Vaping Use  . Vaping Use: Some days  Substance and Sexual Activity  . Alcohol use: Yes  . Drug use: Yes    Frequency: 5.0 times per week    Types: Marijuana  . Sexual activity: Yes    Partners: Female  Other Topics Concern  . Not on file  Social History Narrative  . Not on file   Social Determinants of Health   Financial Resource Strain: Not on file  Food Insecurity: Not on file  Transportation Needs: Not on file  Physical Activity: Not on file  Stress: Not on file  Social Connections: Not on file   Family History: Family History  Problem Relation Age of Onset  . COPD Paternal Aunt   . COPD Paternal Uncle    Allergies: No Known Allergies Medications: See med rec.  Review of Systems: See HPI for pertinent positives and negatives.   Objective:    General: Well Developed, well nourished, and in no acute distress.  Neuro: Alert and oriented x3.  HEENT: Normocephalic, atraumatic.  Skin: Warm and dry. Cardiac: Regular rate and rhythm, no murmurs rubs or gallops, no lower extremity edema.  Respiratory: Clear to auscultation bilaterally. Not using accessory muscles, speaking in full sentences.   Impression and Recommendations:    1. Exposure to sexually transmitted disease (STD) Completing full STI testing panel today. Discussed recommended condom use with all sexual encounters to prevent further infections. Unable to evaluate white bumps at this time.  Advised patient to get a picture if possible to allow his PCP to visualize. Rectal itching and bleeding likely related to hemorrhoids, advised OTC treatments.  - HIV Antibody (routine testing w rflx) - Hepatitis C Antibody - Hepatitis B surface antigen - RPR - Trichomonas vaginalis RNA, Ql,Males - C. trachomatis/N. gonorrhoeae RNA - HSV 1/2 Ab (IgM), IFA w/rflx Titer  Return if symptoms worsen or fail to improve. ___________________________________________ Thayer Ohm, DNP, APRN,  FNP-BC Primary Care and Sports Medicine [facility] [place]

## 2020-07-10 LAB — HEPATITIS C ANTIBODY
Hepatitis C Ab: NONREACTIVE
SIGNAL TO CUT-OFF: 0.03 (ref <1.00)

## 2020-07-10 LAB — RPR: RPR Ser Ql: NONREACTIVE

## 2020-07-10 LAB — HIV ANTIBODY (ROUTINE TESTING W REFLEX): HIV 1&2 Ab, 4th Generation: NONREACTIVE

## 2020-07-10 LAB — C. TRACHOMATIS/N. GONORRHOEAE RNA
C. trachomatis RNA, TMA: NOT DETECTED
N. gonorrhoeae RNA, TMA: NOT DETECTED

## 2020-07-10 LAB — TRICHOMONAS VAGINALIS RNA, QL,MALES: Trichomonas vaginalis RNA: NOT DETECTED

## 2020-07-10 LAB — HSV(HERPES SIMPLEX VRS) I + II AB-IGG
HAV 1 IGG,TYPE SPECIFIC AB: 1.41 index — ABNORMAL HIGH
HSV 2 IGG,TYPE SPECIFIC AB: <0.9 index

## 2020-07-10 LAB — HEPATITIS B SURFACE ANTIGEN: Hepatitis B Surface Ag: NONREACTIVE

## 2020-07-15 LAB — HSV 1/2 AB (IGM), IFA W/RFLX TITER
HSV 1 IgM Screen: NEGATIVE
HSV 2 IgM Screen: NEGATIVE

## 2021-08-08 DIAGNOSIS — J029 Acute pharyngitis, unspecified: Secondary | ICD-10-CM | POA: Diagnosis not present

## 2021-08-08 DIAGNOSIS — R1310 Dysphagia, unspecified: Secondary | ICD-10-CM | POA: Diagnosis not present

## 2021-08-08 DIAGNOSIS — J020 Streptococcal pharyngitis: Secondary | ICD-10-CM | POA: Diagnosis not present

## 2021-08-08 DIAGNOSIS — J040 Acute laryngitis: Secondary | ICD-10-CM | POA: Diagnosis not present

## 2021-08-08 DIAGNOSIS — Z20822 Contact with and (suspected) exposure to covid-19: Secondary | ICD-10-CM | POA: Diagnosis not present

## 2021-09-28 IMAGING — DX DG KNEE COMPLETE 4+V*L*
4 series · 4 of 4 positions shown · non-contrast
Comparison: None.

CLINICAL DATA: Medial knee pain

EXAM:
LEFT KNEE - COMPLETE 4+ VIEW

[knee ap]
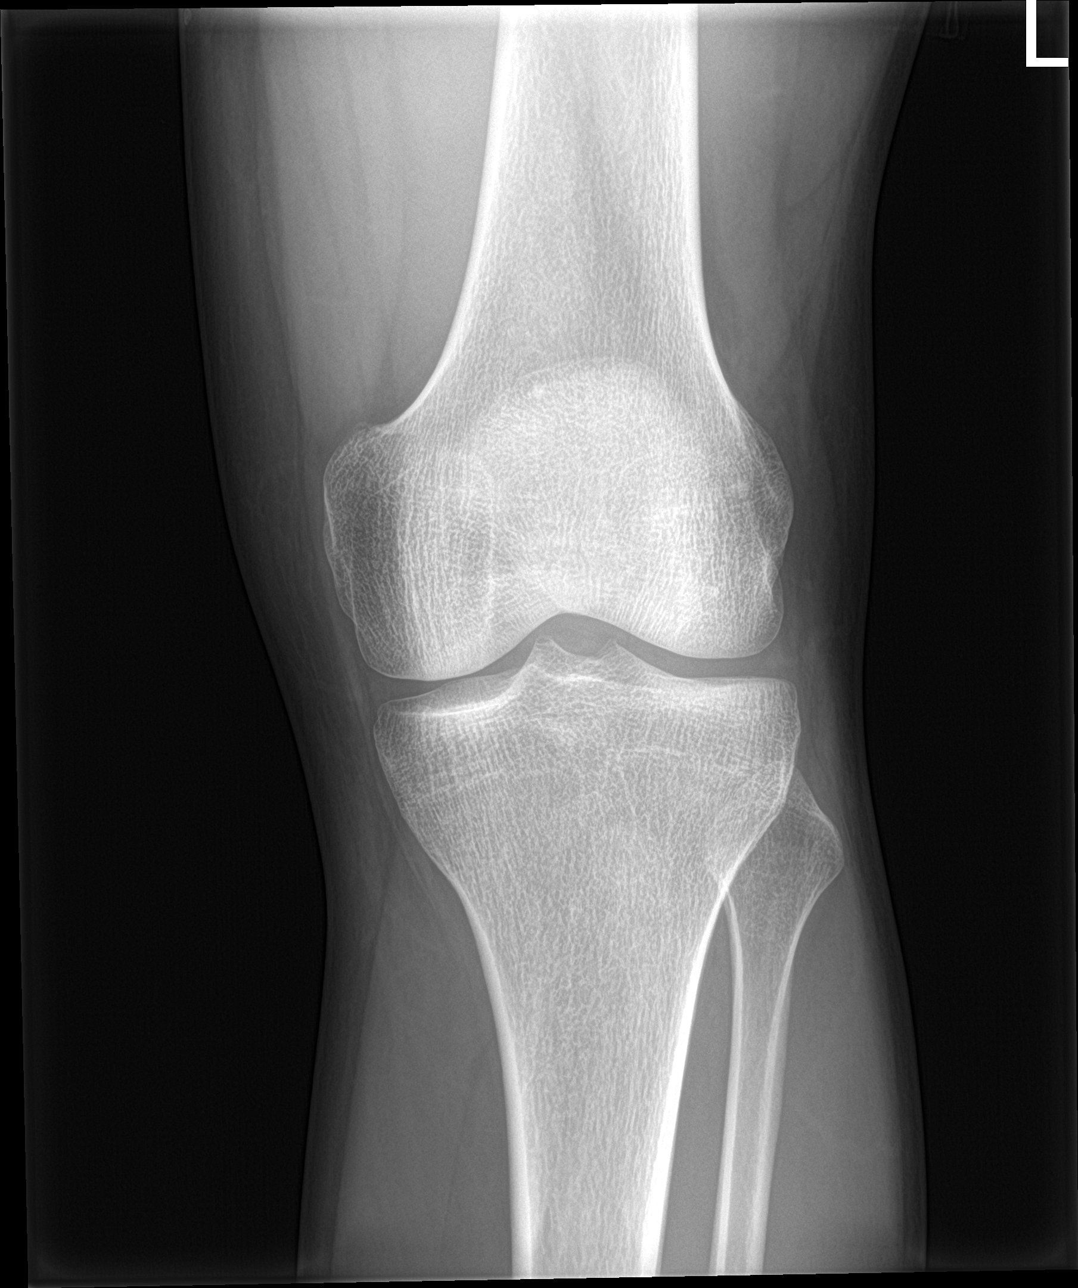

[knee lat]
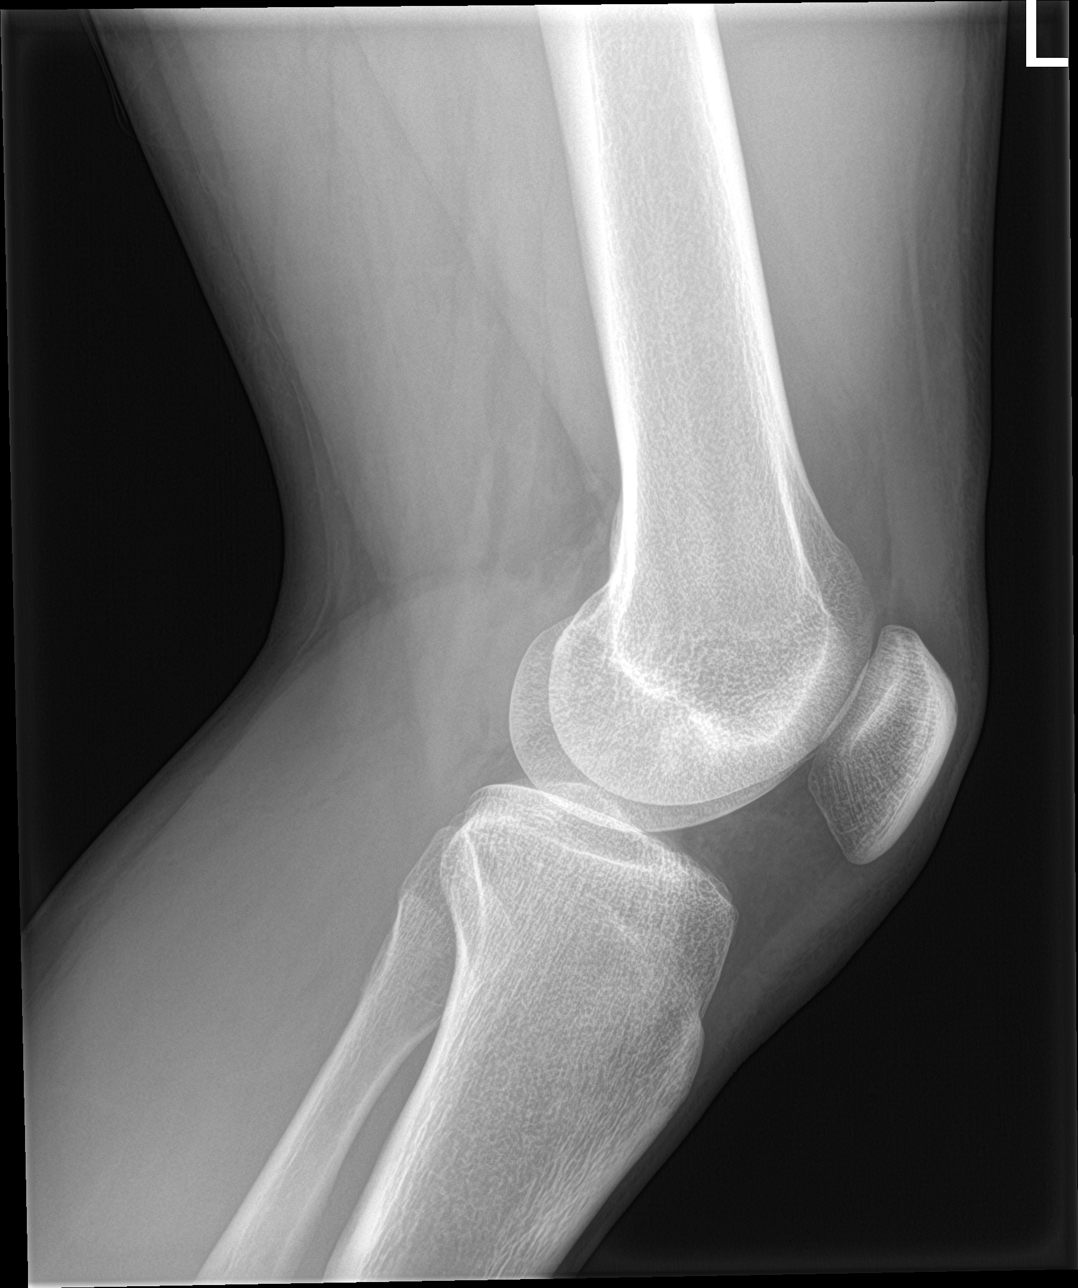

[knee obl (1 of 2)]
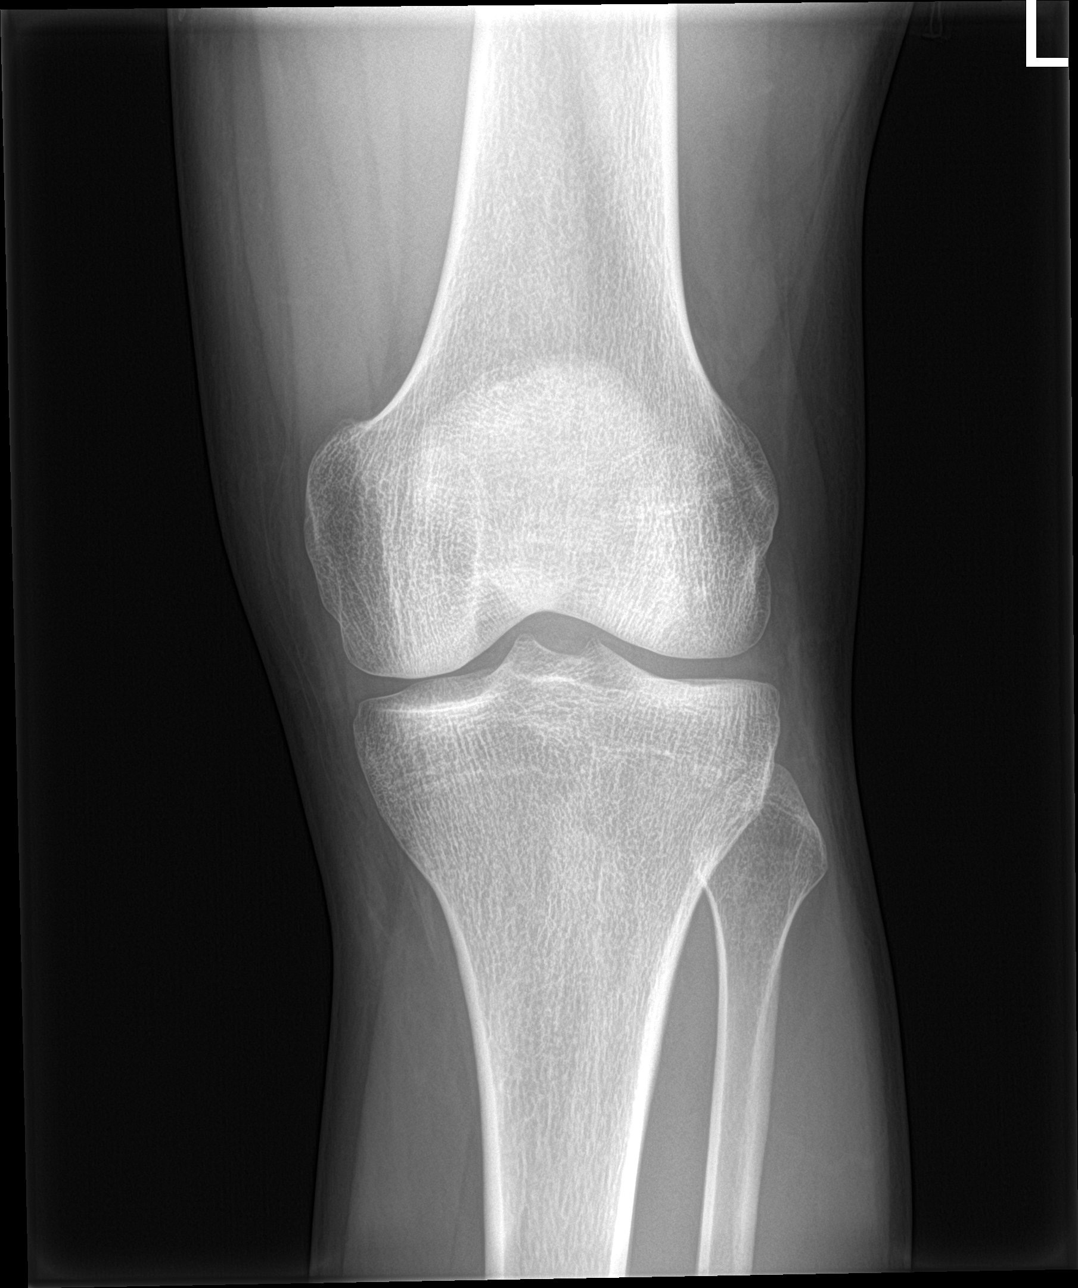

[knee obl (2 of 2)]
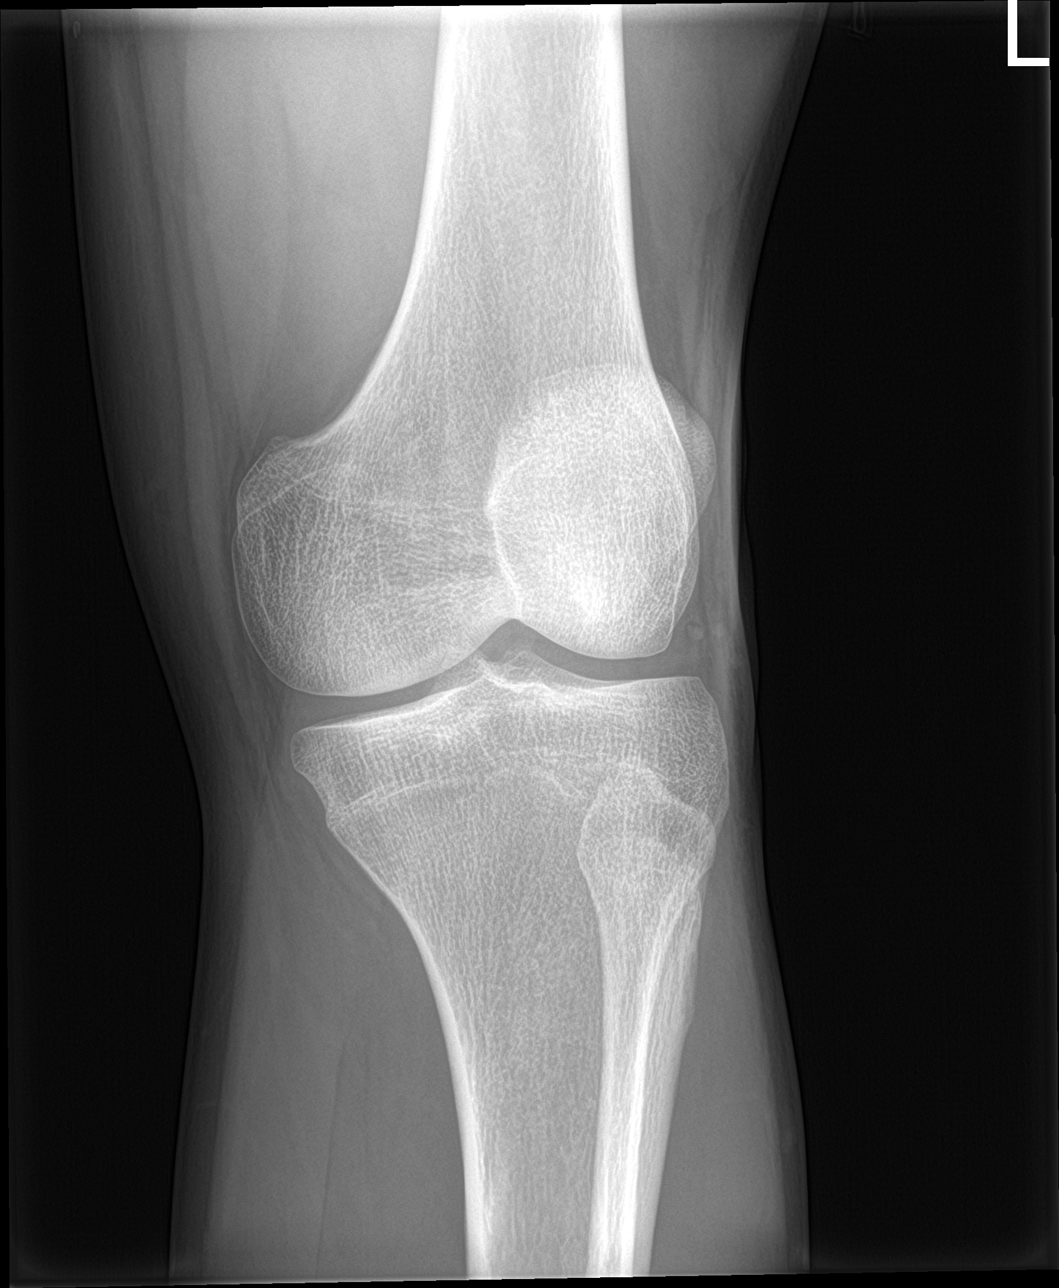

[4 of 4 positions shown; findings below may reference images not displayed]

FINDINGS: No acute fracture or dislocation. Joint spaces and alignment are
maintained. No area of erosion or osseous destruction. No unexpected
radiopaque foreign body. Soft tissues are unremarkable.
IMPRESSION: No acute fracture or dislocation.

## 2021-11-24 ENCOUNTER — Emergency Department (INDEPENDENT_AMBULATORY_CARE_PROVIDER_SITE_OTHER)
Admission: EM | Admit: 2021-11-24 | Discharge: 2021-11-24 | Disposition: A | Discharge status: Home / Self Care | Payer: BC Managed Care – PPO | Source: Home / Self Care

## 2021-11-24 ENCOUNTER — Encounter: Payer: Self-pay | Admitting: Emergency Medicine

## 2021-11-24 DIAGNOSIS — M5442 Lumbago with sciatica, left side: Secondary | ICD-10-CM

## 2021-11-24 DIAGNOSIS — M5441 Lumbago with sciatica, right side: Secondary | ICD-10-CM

## 2021-11-24 MED ORDER — PREDNISONE 50 MG PO TABS: 50.0000 mg | ORAL_TABLET | Freq: Every day | ORAL | 0 refills | Status: AC

## 2021-11-24 MED ORDER — TIZANIDINE HCL 4 MG PO TABS: 4.0000 mg | ORAL_TABLET | Freq: Two times a day (BID) | ORAL | 0 refills | Status: AC | PRN

## 2021-11-24 MED ORDER — DEXAMETHASONE SODIUM PHOSPHATE 10 MG/ML IJ SOLN
10.0000 mg | Freq: Once | INTRAMUSCULAR | Status: AC
  Administered 2021-11-24: 10 mg via INTRAMUSCULAR

## 2021-11-24 MED ORDER — KETOROLAC TROMETHAMINE 30 MG/ML IJ SOLN
30.0000 mg | Freq: Once | INTRAMUSCULAR | Status: AC
  Administered 2021-11-24: 30 mg via INTRAMUSCULAR

## 2021-11-24 NOTE — Discharge Instructions (Addendum)
For acute pain and spasms, take tizanidine 4 mg twice daily as needed for acute pain-medication can cause severe drowsiness.  Avoid taking while driving. Start oral prednisone tomorrow and take daily for 4 days. You received Toradol (antiinflammatory and pain reliever) and Decadron (steriod)  via an injection. Avoid taking any NSAIDS (ibuprofen, Naproxen, Asprin) while taking steriods. You can take Tylenol as needed for pain

## 2021-11-24 NOTE — ED Triage Notes (Addendum)
Woke up with lower back pain on Friday  OTC  tylenol  & icy hot - min relief  Tylenol  1 GM at 0700 Pain radiates down both hamstrings  Dull pain  Here w/ girlfriend

## 2021-11-24 NOTE — ED Provider Notes (Signed)
Renaldo Fiddler    CSN: [id number] Arrival date & time: 11/24/21  0931      History   Chief Complaint Chief Complaint  Patient presents with   Back Pain    lower    HPI Tanner Vincent is a 26 y.o. male.   HPI Patient presents today with acute lower back pain. He reports awakening with back pain x 3 days ago. The pain is radiating bilaterally into his buttocks. He has taken tylenol, ibuprofen, and Icy hot without continued relief. He's experienced mild back pain previously, however, endorses that this episode is worst then previous. No acute injury or recent strenuous lifting. Past Medical History:  Diagnosis Date   Allergy    Asthma    CAP (community acquired pneumonia)    Environmental allergies    Inattention 08/26/2016   Social anxiety disorder 08/26/2016    Patient Active Problem List   Diagnosis Date Noted   Exposure to chlamydia 04/14/2020   Asthma, moderate persistent 04/15/2017   Social anxiety disorder 08/26/2016   Inattention 08/26/2016   Chronic non-seasonal allergic rhinitis 04/21/2011    History reviewed. No pertinent surgical history.     Home Medications    Prior to Admission medications   Medication Sig Start Date End Date Taking? Authorizing Provider  predniSONE (DELTASONE) 50 MG tablet Take 1 tablet (50 mg total) by mouth daily with breakfast for 4 days. 11/25/21 11/29/21 Yes Bing Neighbors, FNP  tiZANidine (ZANAFLEX) 4 MG tablet Take 1 tablet (4 mg total) by mouth 2 (two) times daily as needed for muscle spasms. 11/24/21  Yes Bing Neighbors, FNP    Family History Family History  Problem Relation Age of Onset   Healthy Mother    Healthy Father    COPD Paternal Aunt    COPD Paternal Uncle     Social History Social History   Tobacco Use   Smoking status: Some Days    Types: Cigarettes   Smokeless tobacco: Never  Vaping Use   Vaping Use: Some days   Substances: Flavoring, Nicotine-salt  Substance Use Topics   Alcohol use:  Yes    Alcohol/week: 24.0 standard drinks    Types: 24 Cans of beer per week    Comment: 1 case  per weekend   Drug use: Not Currently    Types: Marijuana     Allergies   Patient has no known allergies.   Review of Systems Review of Systems Pertinent negatives listed in HPI   Physical Exam Triage Vital Signs ED Triage Vitals  Enc Vitals Group     BP 11/24/21 1011 105/69     Pulse Rate 11/24/21 1011 (!) 56     Resp 11/24/21 1011 15     Temp 11/24/21 1011 98.2 F (36.8 C)     Temp Source 11/24/21 1011 Oral     SpO2 11/24/21 1011 97 %     Weight 11/24/21 1014 240 lb (108.9 kg)     Height 11/24/21 1014 6\' 4"  (1.93 m)     Head Circumference --      Peak Flow --      Pain Score 11/24/21 1013 1     Pain Loc --      Pain Edu? --      Excl. in GC? --    No data found.  Updated Vital Signs BP 105/69 (BP Location: Left Arm)   Pulse (!) 56   Temp 98.2 F (36.8 C) (Oral)   Resp  15   Ht 6\' 4"  (1.93 m)   Wt 240 lb (108.9 kg)   SpO2 97%   BMI 29.21 kg/m   Visual Acuity Right Eye Distance:   Left Eye Distance:   Bilateral Distance:    Right Eye Near:   Left Eye Near:    Bilateral Near:     Physical Exam Constitutional:      Appearance: Normal appearance. He is not ill-appearing.  Cardiovascular:     Rate and Rhythm: Normal rate and regular rhythm.  Pulmonary:     Effort: Pulmonary effort is normal.     Breath sounds: Normal breath sounds.  Musculoskeletal:     Cervical back: Normal range of motion. No rigidity.     Lumbar back: Decreased range of motion. Positive right straight leg raise test and positive left straight leg raise test.  Neurological:     Mental Status: He is alert.  Psychiatric:        Attention and Perception: Attention normal.        Mood and Affect: Mood normal.        Speech: Speech normal.        Behavior: Behavior is cooperative.   UC Treatments / Results  Labs (all labs ordered are listed, but only abnormal results are  displayed) Labs Reviewed - No data to display  EKG   Radiology No results found.  Procedures Procedures (including critical care time)  Medications Ordered in UC Medications  dexamethasone (DECADRON) injection 10 mg (10 mg Intramuscular Given 11/24/21 1043)  ketorolac (TORADOL) 30 MG/ML injection 30 mg (30 mg Intramuscular Given 11/24/21 1046)    Initial Impression / Assessment and Plan / UC Course  I have reviewed the triage vital signs and the nursing notes.  Pertinent labs & imaging results that were available during my care of the patient were reviewed by me and considered in my medical decision making (see chart for details).    Acute low back pain IM Toradol and Decadron given Tizanidine 4 mg twice daily PRN  Follow-up if symptoms worsen or do not readily improve Final Clinical Impressions(s) / UC Diagnoses   Final diagnoses:  Acute midline low back pain with bilateral sciatica     Discharge Instructions      For acute pain and spasms, take tizanidine 4 mg twice daily as needed for acute pain-medication can cause severe drowsiness.  Avoid taking while driving. Start oral prednisone tomorrow and take daily for 4 days. You received Toradol (antiinflammatory and pain reliever) and Decadron (steriod)  via an injection. Avoid taking any NSAIDS (ibuprofen, Naproxen, Asprin) while taking steriods. You can take Tylenol as needed for pain     ED Prescriptions     Medication Sig Dispense Auth. Provider   predniSONE (DELTASONE) 50 MG tablet Take 1 tablet (50 mg total) by mouth daily with breakfast for 4 days. 4 tablet 01/24/22, FNP   tiZANidine (ZANAFLEX) 4 MG tablet Take 1 tablet (4 mg total) by mouth 2 (two) times daily as needed for muscle spasms. 20 tablet Bing Neighbors, FNP      PDMP not reviewed this encounter.   Bing Neighbors, FNP 11/25/21 01/25/22

## 2022-05-11 DIAGNOSIS — M4728 Other spondylosis with radiculopathy, sacral and sacrococcygeal region: Secondary | ICD-10-CM | POA: Diagnosis not present

## 2022-05-11 DIAGNOSIS — M4726 Other spondylosis with radiculopathy, lumbar region: Secondary | ICD-10-CM | POA: Diagnosis not present

## 2022-05-11 DIAGNOSIS — M9904 Segmental and somatic dysfunction of sacral region: Secondary | ICD-10-CM | POA: Diagnosis not present

## 2022-05-11 DIAGNOSIS — M9903 Segmental and somatic dysfunction of lumbar region: Secondary | ICD-10-CM | POA: Diagnosis not present

## 2022-05-12 DIAGNOSIS — M9904 Segmental and somatic dysfunction of sacral region: Secondary | ICD-10-CM | POA: Diagnosis not present

## 2022-05-12 DIAGNOSIS — M9903 Segmental and somatic dysfunction of lumbar region: Secondary | ICD-10-CM | POA: Diagnosis not present

## 2022-05-12 DIAGNOSIS — M4726 Other spondylosis with radiculopathy, lumbar region: Secondary | ICD-10-CM | POA: Diagnosis not present

## 2022-05-12 DIAGNOSIS — M4728 Other spondylosis with radiculopathy, sacral and sacrococcygeal region: Secondary | ICD-10-CM | POA: Diagnosis not present

## 2022-05-13 DIAGNOSIS — M9904 Segmental and somatic dysfunction of sacral region: Secondary | ICD-10-CM | POA: Diagnosis not present

## 2022-05-13 DIAGNOSIS — M4728 Other spondylosis with radiculopathy, sacral and sacrococcygeal region: Secondary | ICD-10-CM | POA: Diagnosis not present

## 2022-05-13 DIAGNOSIS — M9903 Segmental and somatic dysfunction of lumbar region: Secondary | ICD-10-CM | POA: Diagnosis not present

## 2022-05-13 DIAGNOSIS — M4726 Other spondylosis with radiculopathy, lumbar region: Secondary | ICD-10-CM | POA: Diagnosis not present

## 2022-05-20 DIAGNOSIS — M9904 Segmental and somatic dysfunction of sacral region: Secondary | ICD-10-CM | POA: Diagnosis not present

## 2022-05-20 DIAGNOSIS — M9903 Segmental and somatic dysfunction of lumbar region: Secondary | ICD-10-CM | POA: Diagnosis not present

## 2022-05-20 DIAGNOSIS — M4726 Other spondylosis with radiculopathy, lumbar region: Secondary | ICD-10-CM | POA: Diagnosis not present

## 2022-05-20 DIAGNOSIS — M4728 Other spondylosis with radiculopathy, sacral and sacrococcygeal region: Secondary | ICD-10-CM | POA: Diagnosis not present

## 2022-05-21 DIAGNOSIS — M9904 Segmental and somatic dysfunction of sacral region: Secondary | ICD-10-CM | POA: Diagnosis not present

## 2022-05-21 DIAGNOSIS — M4728 Other spondylosis with radiculopathy, sacral and sacrococcygeal region: Secondary | ICD-10-CM | POA: Diagnosis not present

## 2022-05-21 DIAGNOSIS — M9903 Segmental and somatic dysfunction of lumbar region: Secondary | ICD-10-CM | POA: Diagnosis not present

## 2022-05-21 DIAGNOSIS — M4726 Other spondylosis with radiculopathy, lumbar region: Secondary | ICD-10-CM | POA: Diagnosis not present

## 2022-05-25 DIAGNOSIS — M9903 Segmental and somatic dysfunction of lumbar region: Secondary | ICD-10-CM | POA: Diagnosis not present

## 2022-05-25 DIAGNOSIS — M4728 Other spondylosis with radiculopathy, sacral and sacrococcygeal region: Secondary | ICD-10-CM | POA: Diagnosis not present

## 2022-05-25 DIAGNOSIS — M9904 Segmental and somatic dysfunction of sacral region: Secondary | ICD-10-CM | POA: Diagnosis not present

## 2022-05-25 DIAGNOSIS — M4726 Other spondylosis with radiculopathy, lumbar region: Secondary | ICD-10-CM | POA: Diagnosis not present

## 2022-05-26 DIAGNOSIS — M4726 Other spondylosis with radiculopathy, lumbar region: Secondary | ICD-10-CM | POA: Diagnosis not present

## 2022-05-26 DIAGNOSIS — M9903 Segmental and somatic dysfunction of lumbar region: Secondary | ICD-10-CM | POA: Diagnosis not present

## 2022-05-26 DIAGNOSIS — M9904 Segmental and somatic dysfunction of sacral region: Secondary | ICD-10-CM | POA: Diagnosis not present

## 2022-05-26 DIAGNOSIS — M4728 Other spondylosis with radiculopathy, sacral and sacrococcygeal region: Secondary | ICD-10-CM | POA: Diagnosis not present

## 2022-05-28 DIAGNOSIS — M9904 Segmental and somatic dysfunction of sacral region: Secondary | ICD-10-CM | POA: Diagnosis not present

## 2022-05-28 DIAGNOSIS — M4726 Other spondylosis with radiculopathy, lumbar region: Secondary | ICD-10-CM | POA: Diagnosis not present

## 2022-05-28 DIAGNOSIS — M9903 Segmental and somatic dysfunction of lumbar region: Secondary | ICD-10-CM | POA: Diagnosis not present

## 2022-05-28 DIAGNOSIS — M4728 Other spondylosis with radiculopathy, sacral and sacrococcygeal region: Secondary | ICD-10-CM | POA: Diagnosis not present

## 2022-06-01 DIAGNOSIS — M9904 Segmental and somatic dysfunction of sacral region: Secondary | ICD-10-CM | POA: Diagnosis not present

## 2022-06-01 DIAGNOSIS — M4726 Other spondylosis with radiculopathy, lumbar region: Secondary | ICD-10-CM | POA: Diagnosis not present

## 2022-06-01 DIAGNOSIS — M4728 Other spondylosis with radiculopathy, sacral and sacrococcygeal region: Secondary | ICD-10-CM | POA: Diagnosis not present

## 2022-06-01 DIAGNOSIS — M9903 Segmental and somatic dysfunction of lumbar region: Secondary | ICD-10-CM | POA: Diagnosis not present

## 2022-06-03 DIAGNOSIS — M9903 Segmental and somatic dysfunction of lumbar region: Secondary | ICD-10-CM | POA: Diagnosis not present

## 2022-06-03 DIAGNOSIS — M4726 Other spondylosis with radiculopathy, lumbar region: Secondary | ICD-10-CM | POA: Diagnosis not present

## 2022-06-03 DIAGNOSIS — M4728 Other spondylosis with radiculopathy, sacral and sacrococcygeal region: Secondary | ICD-10-CM | POA: Diagnosis not present

## 2022-06-03 DIAGNOSIS — M9904 Segmental and somatic dysfunction of sacral region: Secondary | ICD-10-CM | POA: Diagnosis not present

## 2022-06-04 DIAGNOSIS — M4728 Other spondylosis with radiculopathy, sacral and sacrococcygeal region: Secondary | ICD-10-CM | POA: Diagnosis not present

## 2022-06-04 DIAGNOSIS — M9903 Segmental and somatic dysfunction of lumbar region: Secondary | ICD-10-CM | POA: Diagnosis not present

## 2022-06-04 DIAGNOSIS — M4726 Other spondylosis with radiculopathy, lumbar region: Secondary | ICD-10-CM | POA: Diagnosis not present

## 2022-06-04 DIAGNOSIS — M9904 Segmental and somatic dysfunction of sacral region: Secondary | ICD-10-CM | POA: Diagnosis not present

## 2022-06-23 DIAGNOSIS — M4726 Other spondylosis with radiculopathy, lumbar region: Secondary | ICD-10-CM | POA: Diagnosis not present

## 2022-06-23 DIAGNOSIS — M4728 Other spondylosis with radiculopathy, sacral and sacrococcygeal region: Secondary | ICD-10-CM | POA: Diagnosis not present

## 2022-06-23 DIAGNOSIS — M9904 Segmental and somatic dysfunction of sacral region: Secondary | ICD-10-CM | POA: Diagnosis not present

## 2022-06-23 DIAGNOSIS — M9903 Segmental and somatic dysfunction of lumbar region: Secondary | ICD-10-CM | POA: Diagnosis not present

## 2022-06-25 DIAGNOSIS — M9903 Segmental and somatic dysfunction of lumbar region: Secondary | ICD-10-CM | POA: Diagnosis not present

## 2022-06-25 DIAGNOSIS — M4728 Other spondylosis with radiculopathy, sacral and sacrococcygeal region: Secondary | ICD-10-CM | POA: Diagnosis not present

## 2022-06-25 DIAGNOSIS — M4726 Other spondylosis with radiculopathy, lumbar region: Secondary | ICD-10-CM | POA: Diagnosis not present

## 2022-06-25 DIAGNOSIS — M9904 Segmental and somatic dysfunction of sacral region: Secondary | ICD-10-CM | POA: Diagnosis not present

## 2022-07-02 DIAGNOSIS — M4728 Other spondylosis with radiculopathy, sacral and sacrococcygeal region: Secondary | ICD-10-CM | POA: Diagnosis not present

## 2022-07-02 DIAGNOSIS — M4726 Other spondylosis with radiculopathy, lumbar region: Secondary | ICD-10-CM | POA: Diagnosis not present

## 2022-07-02 DIAGNOSIS — M9903 Segmental and somatic dysfunction of lumbar region: Secondary | ICD-10-CM | POA: Diagnosis not present

## 2022-07-02 DIAGNOSIS — M9904 Segmental and somatic dysfunction of sacral region: Secondary | ICD-10-CM | POA: Diagnosis not present

## 2022-07-09 DIAGNOSIS — M9904 Segmental and somatic dysfunction of sacral region: Secondary | ICD-10-CM | POA: Diagnosis not present

## 2022-07-09 DIAGNOSIS — M9903 Segmental and somatic dysfunction of lumbar region: Secondary | ICD-10-CM | POA: Diagnosis not present

## 2022-07-09 DIAGNOSIS — M4726 Other spondylosis with radiculopathy, lumbar region: Secondary | ICD-10-CM | POA: Diagnosis not present

## 2022-07-09 DIAGNOSIS — M4728 Other spondylosis with radiculopathy, sacral and sacrococcygeal region: Secondary | ICD-10-CM | POA: Diagnosis not present

## 2022-07-16 DIAGNOSIS — M9904 Segmental and somatic dysfunction of sacral region: Secondary | ICD-10-CM | POA: Diagnosis not present

## 2022-07-16 DIAGNOSIS — M9903 Segmental and somatic dysfunction of lumbar region: Secondary | ICD-10-CM | POA: Diagnosis not present

## 2022-07-16 DIAGNOSIS — M4726 Other spondylosis with radiculopathy, lumbar region: Secondary | ICD-10-CM | POA: Diagnosis not present

## 2022-07-16 DIAGNOSIS — M4728 Other spondylosis with radiculopathy, sacral and sacrococcygeal region: Secondary | ICD-10-CM | POA: Diagnosis not present

## 2022-07-30 DIAGNOSIS — M9903 Segmental and somatic dysfunction of lumbar region: Secondary | ICD-10-CM | POA: Diagnosis not present

## 2022-07-30 DIAGNOSIS — M4728 Other spondylosis with radiculopathy, sacral and sacrococcygeal region: Secondary | ICD-10-CM | POA: Diagnosis not present

## 2022-07-30 DIAGNOSIS — M4726 Other spondylosis with radiculopathy, lumbar region: Secondary | ICD-10-CM | POA: Diagnosis not present

## 2022-07-30 DIAGNOSIS — M9904 Segmental and somatic dysfunction of sacral region: Secondary | ICD-10-CM | POA: Diagnosis not present

## 2022-08-03 DIAGNOSIS — M4726 Other spondylosis with radiculopathy, lumbar region: Secondary | ICD-10-CM | POA: Diagnosis not present

## 2022-08-03 DIAGNOSIS — M47811 Spondylosis without myelopathy or radiculopathy, occipito-atlanto-axial region: Secondary | ICD-10-CM | POA: Diagnosis not present

## 2022-08-03 DIAGNOSIS — M4728 Other spondylosis with radiculopathy, sacral and sacrococcygeal region: Secondary | ICD-10-CM | POA: Diagnosis not present

## 2022-08-03 DIAGNOSIS — M25532 Pain in left wrist: Secondary | ICD-10-CM | POA: Diagnosis not present

## 2022-08-10 DIAGNOSIS — M4728 Other spondylosis with radiculopathy, sacral and sacrococcygeal region: Secondary | ICD-10-CM | POA: Diagnosis not present

## 2022-08-10 DIAGNOSIS — M4726 Other spondylosis with radiculopathy, lumbar region: Secondary | ICD-10-CM | POA: Diagnosis not present

## 2022-08-10 DIAGNOSIS — M47811 Spondylosis without myelopathy or radiculopathy, occipito-atlanto-axial region: Secondary | ICD-10-CM | POA: Diagnosis not present

## 2022-08-10 DIAGNOSIS — M25532 Pain in left wrist: Secondary | ICD-10-CM | POA: Diagnosis not present

## 2022-09-16 DIAGNOSIS — M4728 Other spondylosis with radiculopathy, sacral and sacrococcygeal region: Secondary | ICD-10-CM | POA: Diagnosis not present

## 2022-09-16 DIAGNOSIS — M47811 Spondylosis without myelopathy or radiculopathy, occipito-atlanto-axial region: Secondary | ICD-10-CM | POA: Diagnosis not present

## 2022-09-16 DIAGNOSIS — M4726 Other spondylosis with radiculopathy, lumbar region: Secondary | ICD-10-CM | POA: Diagnosis not present

## 2022-09-16 DIAGNOSIS — M25532 Pain in left wrist: Secondary | ICD-10-CM | POA: Diagnosis not present

## 2022-10-15 DIAGNOSIS — M47811 Spondylosis without myelopathy or radiculopathy, occipito-atlanto-axial region: Secondary | ICD-10-CM | POA: Diagnosis not present

## 2022-10-15 DIAGNOSIS — M4726 Other spondylosis with radiculopathy, lumbar region: Secondary | ICD-10-CM | POA: Diagnosis not present

## 2022-10-15 DIAGNOSIS — M4728 Other spondylosis with radiculopathy, sacral and sacrococcygeal region: Secondary | ICD-10-CM | POA: Diagnosis not present

## 2022-10-15 DIAGNOSIS — M25532 Pain in left wrist: Secondary | ICD-10-CM | POA: Diagnosis not present

## 2022-10-29 DIAGNOSIS — M4726 Other spondylosis with radiculopathy, lumbar region: Secondary | ICD-10-CM | POA: Diagnosis not present

## 2022-10-29 DIAGNOSIS — M4728 Other spondylosis with radiculopathy, sacral and sacrococcygeal region: Secondary | ICD-10-CM | POA: Diagnosis not present

## 2022-10-29 DIAGNOSIS — M25532 Pain in left wrist: Secondary | ICD-10-CM | POA: Diagnosis not present

## 2022-10-29 DIAGNOSIS — M47811 Spondylosis without myelopathy or radiculopathy, occipito-atlanto-axial region: Secondary | ICD-10-CM | POA: Diagnosis not present

## 2022-11-12 DIAGNOSIS — M4728 Other spondylosis with radiculopathy, sacral and sacrococcygeal region: Secondary | ICD-10-CM | POA: Diagnosis not present

## 2022-11-12 DIAGNOSIS — M4726 Other spondylosis with radiculopathy, lumbar region: Secondary | ICD-10-CM | POA: Diagnosis not present

## 2022-11-12 DIAGNOSIS — M25532 Pain in left wrist: Secondary | ICD-10-CM | POA: Diagnosis not present

## 2022-11-12 DIAGNOSIS — M47811 Spondylosis without myelopathy or radiculopathy, occipito-atlanto-axial region: Secondary | ICD-10-CM | POA: Diagnosis not present

## 2022-11-26 DIAGNOSIS — M47811 Spondylosis without myelopathy or radiculopathy, occipito-atlanto-axial region: Secondary | ICD-10-CM | POA: Diagnosis not present

## 2022-11-26 DIAGNOSIS — M25532 Pain in left wrist: Secondary | ICD-10-CM | POA: Diagnosis not present

## 2022-11-26 DIAGNOSIS — M4726 Other spondylosis with radiculopathy, lumbar region: Secondary | ICD-10-CM | POA: Diagnosis not present

## 2022-11-26 DIAGNOSIS — M4728 Other spondylosis with radiculopathy, sacral and sacrococcygeal region: Secondary | ICD-10-CM | POA: Diagnosis not present

## 2022-12-10 DIAGNOSIS — M47811 Spondylosis without myelopathy or radiculopathy, occipito-atlanto-axial region: Secondary | ICD-10-CM | POA: Diagnosis not present

## 2022-12-10 DIAGNOSIS — M4728 Other spondylosis with radiculopathy, sacral and sacrococcygeal region: Secondary | ICD-10-CM | POA: Diagnosis not present

## 2022-12-10 DIAGNOSIS — M4726 Other spondylosis with radiculopathy, lumbar region: Secondary | ICD-10-CM | POA: Diagnosis not present

## 2022-12-10 DIAGNOSIS — M25532 Pain in left wrist: Secondary | ICD-10-CM | POA: Diagnosis not present

## 2023-01-05 DIAGNOSIS — L252 Unspecified contact dermatitis due to dyes: Secondary | ICD-10-CM | POA: Diagnosis not present

## 2023-01-05 DIAGNOSIS — L814 Other melanin hyperpigmentation: Secondary | ICD-10-CM | POA: Diagnosis not present

## 2023-01-05 DIAGNOSIS — D239 Other benign neoplasm of skin, unspecified: Secondary | ICD-10-CM | POA: Diagnosis not present

## 2023-01-05 DIAGNOSIS — L720 Epidermal cyst: Secondary | ICD-10-CM | POA: Diagnosis not present

## 2023-01-07 DIAGNOSIS — M4728 Other spondylosis with radiculopathy, sacral and sacrococcygeal region: Secondary | ICD-10-CM | POA: Diagnosis not present

## 2023-01-07 DIAGNOSIS — M4726 Other spondylosis with radiculopathy, lumbar region: Secondary | ICD-10-CM | POA: Diagnosis not present

## 2023-01-07 DIAGNOSIS — M25532 Pain in left wrist: Secondary | ICD-10-CM | POA: Diagnosis not present

## 2023-01-07 DIAGNOSIS — M47811 Spondylosis without myelopathy or radiculopathy, occipito-atlanto-axial region: Secondary | ICD-10-CM | POA: Diagnosis not present

## 2023-01-21 DIAGNOSIS — M25532 Pain in left wrist: Secondary | ICD-10-CM | POA: Diagnosis not present

## 2023-01-21 DIAGNOSIS — M4726 Other spondylosis with radiculopathy, lumbar region: Secondary | ICD-10-CM | POA: Diagnosis not present

## 2023-01-21 DIAGNOSIS — M4728 Other spondylosis with radiculopathy, sacral and sacrococcygeal region: Secondary | ICD-10-CM | POA: Diagnosis not present

## 2023-01-21 DIAGNOSIS — M47811 Spondylosis without myelopathy or radiculopathy, occipito-atlanto-axial region: Secondary | ICD-10-CM | POA: Diagnosis not present

## 2023-02-04 DIAGNOSIS — M47811 Spondylosis without myelopathy or radiculopathy, occipito-atlanto-axial region: Secondary | ICD-10-CM | POA: Diagnosis not present

## 2023-02-04 DIAGNOSIS — M25532 Pain in left wrist: Secondary | ICD-10-CM | POA: Diagnosis not present

## 2023-02-04 DIAGNOSIS — M4726 Other spondylosis with radiculopathy, lumbar region: Secondary | ICD-10-CM | POA: Diagnosis not present

## 2023-02-04 DIAGNOSIS — M4728 Other spondylosis with radiculopathy, sacral and sacrococcygeal region: Secondary | ICD-10-CM | POA: Diagnosis not present

## 2023-02-18 DIAGNOSIS — M47811 Spondylosis without myelopathy or radiculopathy, occipito-atlanto-axial region: Secondary | ICD-10-CM | POA: Diagnosis not present

## 2023-02-18 DIAGNOSIS — M25532 Pain in left wrist: Secondary | ICD-10-CM | POA: Diagnosis not present

## 2023-02-18 DIAGNOSIS — M4726 Other spondylosis with radiculopathy, lumbar region: Secondary | ICD-10-CM | POA: Diagnosis not present

## 2023-02-18 DIAGNOSIS — M4728 Other spondylosis with radiculopathy, sacral and sacrococcygeal region: Secondary | ICD-10-CM | POA: Diagnosis not present

## 2023-03-04 DIAGNOSIS — M4728 Other spondylosis with radiculopathy, sacral and sacrococcygeal region: Secondary | ICD-10-CM | POA: Diagnosis not present

## 2023-03-04 DIAGNOSIS — M25532 Pain in left wrist: Secondary | ICD-10-CM | POA: Diagnosis not present

## 2023-03-04 DIAGNOSIS — M4726 Other spondylosis with radiculopathy, lumbar region: Secondary | ICD-10-CM | POA: Diagnosis not present

## 2023-03-04 DIAGNOSIS — M47811 Spondylosis without myelopathy or radiculopathy, occipito-atlanto-axial region: Secondary | ICD-10-CM | POA: Diagnosis not present

## 2023-03-18 DIAGNOSIS — M47811 Spondylosis without myelopathy or radiculopathy, occipito-atlanto-axial region: Secondary | ICD-10-CM | POA: Diagnosis not present

## 2023-03-18 DIAGNOSIS — M4728 Other spondylosis with radiculopathy, sacral and sacrococcygeal region: Secondary | ICD-10-CM | POA: Diagnosis not present

## 2023-03-18 DIAGNOSIS — M25532 Pain in left wrist: Secondary | ICD-10-CM | POA: Diagnosis not present

## 2023-03-18 DIAGNOSIS — M4726 Other spondylosis with radiculopathy, lumbar region: Secondary | ICD-10-CM | POA: Diagnosis not present

## 2023-03-25 DIAGNOSIS — M4728 Other spondylosis with radiculopathy, sacral and sacrococcygeal region: Secondary | ICD-10-CM | POA: Diagnosis not present

## 2023-03-25 DIAGNOSIS — M4726 Other spondylosis with radiculopathy, lumbar region: Secondary | ICD-10-CM | POA: Diagnosis not present

## 2023-03-25 DIAGNOSIS — M47811 Spondylosis without myelopathy or radiculopathy, occipito-atlanto-axial region: Secondary | ICD-10-CM | POA: Diagnosis not present

## 2023-03-25 DIAGNOSIS — M25532 Pain in left wrist: Secondary | ICD-10-CM | POA: Diagnosis not present

## 2023-04-07 DIAGNOSIS — D225 Melanocytic nevi of trunk: Secondary | ICD-10-CM | POA: Diagnosis not present

## 2023-04-07 DIAGNOSIS — D2371 Other benign neoplasm of skin of right lower limb, including hip: Secondary | ICD-10-CM | POA: Diagnosis not present

## 2023-04-07 DIAGNOSIS — L218 Other seborrheic dermatitis: Secondary | ICD-10-CM | POA: Diagnosis not present

## 2023-04-07 DIAGNOSIS — D485 Neoplasm of uncertain behavior of skin: Secondary | ICD-10-CM | POA: Diagnosis not present

## 2023-04-08 DIAGNOSIS — M4728 Other spondylosis with radiculopathy, sacral and sacrococcygeal region: Secondary | ICD-10-CM | POA: Diagnosis not present

## 2023-04-08 DIAGNOSIS — M47811 Spondylosis without myelopathy or radiculopathy, occipito-atlanto-axial region: Secondary | ICD-10-CM | POA: Diagnosis not present

## 2023-04-08 DIAGNOSIS — M25532 Pain in left wrist: Secondary | ICD-10-CM | POA: Diagnosis not present

## 2023-04-08 DIAGNOSIS — M4726 Other spondylosis with radiculopathy, lumbar region: Secondary | ICD-10-CM | POA: Diagnosis not present

## 2023-04-15 DIAGNOSIS — M47811 Spondylosis without myelopathy or radiculopathy, occipito-atlanto-axial region: Secondary | ICD-10-CM | POA: Diagnosis not present

## 2023-04-15 DIAGNOSIS — M4726 Other spondylosis with radiculopathy, lumbar region: Secondary | ICD-10-CM | POA: Diagnosis not present

## 2023-04-15 DIAGNOSIS — M25532 Pain in left wrist: Secondary | ICD-10-CM | POA: Diagnosis not present

## 2023-04-15 DIAGNOSIS — M4728 Other spondylosis with radiculopathy, sacral and sacrococcygeal region: Secondary | ICD-10-CM | POA: Diagnosis not present

## 2023-04-29 DIAGNOSIS — M4728 Other spondylosis with radiculopathy, sacral and sacrococcygeal region: Secondary | ICD-10-CM | POA: Diagnosis not present

## 2023-04-29 DIAGNOSIS — M25532 Pain in left wrist: Secondary | ICD-10-CM | POA: Diagnosis not present

## 2023-04-29 DIAGNOSIS — M47811 Spondylosis without myelopathy or radiculopathy, occipito-atlanto-axial region: Secondary | ICD-10-CM | POA: Diagnosis not present

## 2023-04-29 DIAGNOSIS — M4726 Other spondylosis with radiculopathy, lumbar region: Secondary | ICD-10-CM | POA: Diagnosis not present

## 2023-05-17 DIAGNOSIS — M4728 Other spondylosis with radiculopathy, sacral and sacrococcygeal region: Secondary | ICD-10-CM | POA: Diagnosis not present

## 2023-05-17 DIAGNOSIS — M47811 Spondylosis without myelopathy or radiculopathy, occipito-atlanto-axial region: Secondary | ICD-10-CM | POA: Diagnosis not present

## 2023-05-17 DIAGNOSIS — M25532 Pain in left wrist: Secondary | ICD-10-CM | POA: Diagnosis not present

## 2023-05-17 DIAGNOSIS — M4726 Other spondylosis with radiculopathy, lumbar region: Secondary | ICD-10-CM | POA: Diagnosis not present

## 2023-06-03 DIAGNOSIS — M25532 Pain in left wrist: Secondary | ICD-10-CM | POA: Diagnosis not present

## 2023-06-03 DIAGNOSIS — M4726 Other spondylosis with radiculopathy, lumbar region: Secondary | ICD-10-CM | POA: Diagnosis not present

## 2023-06-03 DIAGNOSIS — M4728 Other spondylosis with radiculopathy, sacral and sacrococcygeal region: Secondary | ICD-10-CM | POA: Diagnosis not present

## 2023-06-03 DIAGNOSIS — M47811 Spondylosis without myelopathy or radiculopathy, occipito-atlanto-axial region: Secondary | ICD-10-CM | POA: Diagnosis not present

## 2023-06-14 DIAGNOSIS — M4726 Other spondylosis with radiculopathy, lumbar region: Secondary | ICD-10-CM | POA: Diagnosis not present

## 2023-06-14 DIAGNOSIS — M4728 Other spondylosis with radiculopathy, sacral and sacrococcygeal region: Secondary | ICD-10-CM | POA: Diagnosis not present

## 2023-06-14 DIAGNOSIS — M25532 Pain in left wrist: Secondary | ICD-10-CM | POA: Diagnosis not present

## 2023-06-14 DIAGNOSIS — M47811 Spondylosis without myelopathy or radiculopathy, occipito-atlanto-axial region: Secondary | ICD-10-CM | POA: Diagnosis not present
